# Patient Record
Sex: Male | Born: 1970 | Race: White | Hispanic: No | Marital: Single
Health system: Southern US, Community
[De-identification: ages and names within clinical notes are randomized; demographics above are authoritative.]

---

## 2005-12-25 ENCOUNTER — Ambulatory Visit: Payer: Self-pay | Admitting: Family Medicine

## 2006-02-11 ENCOUNTER — Ambulatory Visit: Payer: Self-pay | Admitting: Unknown Physician Specialty

## 2006-03-04 ENCOUNTER — Inpatient Hospital Stay: Payer: Self-pay | Admitting: Surgery

## 2006-03-04 ENCOUNTER — Other Ambulatory Visit: Payer: Self-pay

## 2006-03-09 ENCOUNTER — Inpatient Hospital Stay: Payer: Self-pay | Admitting: Surgery

## 2006-09-03 ENCOUNTER — Emergency Department: Payer: Self-pay | Admitting: Unknown Physician Specialty

## 2006-09-21 ENCOUNTER — Emergency Department: Payer: Self-pay | Admitting: General Practice

## 2007-03-11 ENCOUNTER — Other Ambulatory Visit: Payer: Self-pay

## 2007-03-11 ENCOUNTER — Emergency Department: Payer: Self-pay | Admitting: Emergency Medicine

## 2007-05-19 ENCOUNTER — Other Ambulatory Visit: Payer: Self-pay

## 2007-05-19 ENCOUNTER — Emergency Department: Payer: Self-pay | Admitting: Emergency Medicine

## 2007-06-08 ENCOUNTER — Inpatient Hospital Stay: Payer: Self-pay | Admitting: Internal Medicine

## 2007-07-14 ENCOUNTER — Emergency Department: Payer: Self-pay | Admitting: Emergency Medicine

## 2007-08-09 ENCOUNTER — Emergency Department: Payer: Self-pay | Admitting: Internal Medicine

## 2007-08-12 ENCOUNTER — Observation Stay: Payer: Self-pay | Admitting: Internal Medicine

## 2007-08-14 ENCOUNTER — Other Ambulatory Visit: Payer: Self-pay

## 2007-08-14 ENCOUNTER — Emergency Department: Payer: Self-pay | Admitting: Emergency Medicine

## 2007-08-24 ENCOUNTER — Inpatient Hospital Stay: Payer: Self-pay | Admitting: Internal Medicine

## 2008-01-13 ENCOUNTER — Emergency Department: Payer: Self-pay | Admitting: Emergency Medicine

## 2008-02-17 ENCOUNTER — Emergency Department: Payer: Self-pay | Admitting: Internal Medicine

## 2008-02-21 ENCOUNTER — Inpatient Hospital Stay: Payer: Self-pay | Admitting: Internal Medicine

## 2008-03-11 ENCOUNTER — Other Ambulatory Visit: Payer: Self-pay

## 2008-03-11 ENCOUNTER — Emergency Department: Payer: Self-pay | Admitting: Emergency Medicine

## 2008-03-12 ENCOUNTER — Observation Stay: Payer: Self-pay | Admitting: Internal Medicine

## 2008-04-15 ENCOUNTER — Other Ambulatory Visit: Payer: Self-pay

## 2008-04-15 ENCOUNTER — Emergency Department: Payer: Self-pay | Admitting: Internal Medicine

## 2008-05-06 ENCOUNTER — Emergency Department: Payer: Self-pay | Admitting: Emergency Medicine

## 2008-05-30 ENCOUNTER — Emergency Department: Payer: Self-pay | Admitting: Emergency Medicine

## 2008-06-05 ENCOUNTER — Emergency Department: Payer: Self-pay | Admitting: Emergency Medicine

## 2008-08-18 ENCOUNTER — Emergency Department: Payer: Self-pay | Admitting: Emergency Medicine

## 2008-11-17 ENCOUNTER — Emergency Department: Payer: Self-pay | Admitting: Emergency Medicine

## 2008-12-01 ENCOUNTER — Emergency Department: Payer: Self-pay | Admitting: Emergency Medicine

## 2008-12-08 ENCOUNTER — Emergency Department: Payer: Self-pay | Admitting: Emergency Medicine

## 2009-01-07 ENCOUNTER — Inpatient Hospital Stay: Payer: Self-pay | Admitting: Internal Medicine

## 2009-01-14 ENCOUNTER — Observation Stay: Payer: Self-pay | Admitting: Internal Medicine

## 2009-01-18 ENCOUNTER — Emergency Department: Payer: Self-pay | Admitting: Emergency Medicine

## 2009-02-24 ENCOUNTER — Emergency Department: Payer: Self-pay | Admitting: Emergency Medicine

## 2009-03-05 ENCOUNTER — Emergency Department: Payer: Self-pay | Admitting: Unknown Physician Specialty

## 2009-03-12 ENCOUNTER — Inpatient Hospital Stay: Payer: Self-pay | Admitting: Internal Medicine

## 2009-03-15 ENCOUNTER — Emergency Department: Payer: Self-pay | Admitting: Emergency Medicine

## 2009-03-30 ENCOUNTER — Ambulatory Visit: Payer: Self-pay | Admitting: Internal Medicine

## 2009-04-22 ENCOUNTER — Emergency Department: Payer: Self-pay | Admitting: Internal Medicine

## 2009-06-25 ENCOUNTER — Emergency Department: Payer: Self-pay | Admitting: Emergency Medicine

## 2009-07-13 ENCOUNTER — Emergency Department: Payer: Self-pay | Admitting: Emergency Medicine

## 2009-08-15 ENCOUNTER — Inpatient Hospital Stay: Payer: Self-pay | Admitting: Internal Medicine

## 2009-10-04 ENCOUNTER — Emergency Department: Payer: Self-pay | Admitting: Emergency Medicine

## 2009-10-27 ENCOUNTER — Emergency Department: Payer: Self-pay | Admitting: Emergency Medicine

## 2009-12-06 ENCOUNTER — Emergency Department: Payer: Self-pay | Admitting: Emergency Medicine

## 2010-01-19 ENCOUNTER — Emergency Department: Payer: Self-pay | Admitting: Emergency Medicine

## 2010-04-26 ENCOUNTER — Inpatient Hospital Stay: Payer: Self-pay | Admitting: Internal Medicine

## 2010-05-28 ENCOUNTER — Emergency Department: Payer: Self-pay | Admitting: Emergency Medicine

## 2012-11-25 ENCOUNTER — Emergency Department: Payer: Self-pay | Admitting: Emergency Medicine

## 2012-11-26 ENCOUNTER — Emergency Department: Payer: Self-pay | Admitting: Emergency Medicine

## 2012-11-26 LAB — URINALYSIS, COMPLETE
Bilirubin,UR: NEGATIVE
Blood: NEGATIVE
Ketone: NEGATIVE
Leukocyte Esterase: NEGATIVE
Nitrite: NEGATIVE
Specific Gravity: 1.011 (ref 1.003–1.030)
Squamous Epithelial: NONE SEEN

## 2012-11-26 LAB — CBC WITH DIFFERENTIAL/PLATELET
Basophil #: 0 10*3/uL (ref 0.0–0.1)
HCT: 31.1 % — ABNORMAL LOW (ref 40.0–52.0)
Lymphocyte #: 1.2 10*3/uL (ref 1.0–3.6)
Lymphocyte %: 26.7 %
MCH: 27.1 pg (ref 26.0–34.0)
MCV: 85 fL (ref 80–100)
Monocyte #: 0.3 x10 3/mm (ref 0.2–1.0)
Monocyte %: 7.7 %
Neutrophil #: 2.8 10*3/uL (ref 1.4–6.5)
Neutrophil %: 63.5 %
RBC: 3.68 10*6/uL — ABNORMAL LOW (ref 4.40–5.90)
RDW: 18.7 % — ABNORMAL HIGH (ref 11.5–14.5)
WBC: 4.5 10*3/uL (ref 3.8–10.6)

## 2012-11-26 LAB — BASIC METABOLIC PANEL
Calcium, Total: 8.2 mg/dL — ABNORMAL LOW (ref 8.5–10.1)
Chloride: 110 mmol/L — ABNORMAL HIGH (ref 98–107)
Co2: 21 mmol/L (ref 21–32)
Creatinine: 1.43 mg/dL — ABNORMAL HIGH (ref 0.60–1.30)
EGFR (Non-African Amer.): >60
Glucose: 382 mg/dL — ABNORMAL HIGH (ref 65–99)
Potassium: 3.6 mmol/L (ref 3.5–5.1)

## 2012-11-26 LAB — COMPREHENSIVE METABOLIC PANEL
Albumin: 3.2 g/dL — ABNORMAL LOW (ref 3.4–5.0)
Alkaline Phosphatase: 206 U/L — ABNORMAL HIGH (ref 50–136)
BUN: 22 mg/dL — ABNORMAL HIGH (ref 7–18)
Calcium, Total: 7.9 mg/dL — ABNORMAL LOW (ref 8.5–10.1)
Chloride: 102 mmol/L (ref 98–107)
Co2: 22 mmol/L (ref 21–32)
Creatinine: 1.63 mg/dL — ABNORMAL HIGH (ref 0.60–1.30)
EGFR (African American): >60
EGFR (Non-African Amer.): 52 — ABNORMAL LOW
Glucose: 723 mg/dL (ref 65–99)
Osmolality: 303 (ref 275–301)
SGPT (ALT): 145 U/L — ABNORMAL HIGH (ref 12–78)
Sodium: 132 mmol/L — ABNORMAL LOW (ref 136–145)

## 2012-11-26 LAB — LIPASE, BLOOD: Lipase: 21 U/L — ABNORMAL LOW (ref 73–393)

## 2012-12-01 ENCOUNTER — Observation Stay: Payer: Self-pay | Admitting: Internal Medicine

## 2012-12-01 LAB — COMPREHENSIVE METABOLIC PANEL
Alkaline Phosphatase: 177 U/L — ABNORMAL HIGH (ref 50–136)
Anion Gap: 7 (ref 7–16)
Co2: 26 mmol/L (ref 21–32)
EGFR (Non-African Amer.): 48 — ABNORMAL LOW
Osmolality: 294 (ref 275–301)
Potassium: 3.9 mmol/L (ref 3.5–5.1)
SGPT (ALT): 81 U/L — ABNORMAL HIGH (ref 12–78)
Sodium: 128 mmol/L — ABNORMAL LOW (ref 136–145)
Total Protein: 7.2 g/dL (ref 6.4–8.2)

## 2012-12-01 LAB — TROPONIN I
Troponin-I: <0.02 ng/mL
Troponin-I: <0.02 ng/mL

## 2012-12-01 LAB — CBC
HCT: 33.4 % — ABNORMAL LOW (ref 40.0–52.0)
RBC: 3.98 10*6/uL — ABNORMAL LOW (ref 4.40–5.90)

## 2012-12-01 LAB — LIPID PANEL
Cholesterol: 161 mg/dL (ref 0–200)
HDL Cholesterol: 65 mg/dL — ABNORMAL HIGH (ref 40–60)
Ldl Cholesterol, Calc: 34 mg/dL (ref 0–100)
Triglycerides: 308 mg/dL — ABNORMAL HIGH (ref 0–200)
VLDL Cholesterol, Calc: 62 mg/dL — ABNORMAL HIGH (ref 5–40)

## 2012-12-01 LAB — TSH: Thyroid Stimulating Horm: 2.48 u[IU]/mL

## 2012-12-01 LAB — PHOSPHORUS: Phosphorus: 3.5 mg/dL (ref 2.5–4.9)

## 2012-12-01 LAB — MAGNESIUM: Magnesium: 2 mg/dL

## 2012-12-02 LAB — CBC WITH DIFFERENTIAL/PLATELET
Basophil #: 0.1 10*3/uL (ref 0.0–0.1)
Eosinophil #: 0.2 10*3/uL (ref 0.0–0.7)
Eosinophil %: 2.7 %
HCT: 28.9 % — ABNORMAL LOW (ref 40.0–52.0)
HGB: 9.6 g/dL — ABNORMAL LOW (ref 13.0–18.0)
Lymphocyte #: 3 10*3/uL (ref 1.0–3.6)
Lymphocyte %: 49.9 %
MCH: 27.3 pg (ref 26.0–34.0)
MCHC: 33 g/dL (ref 32.0–36.0)
MCV: 83 fL (ref 80–100)
Monocyte %: 7.1 %
Neutrophil %: 38.9 %
RBC: 3.49 10*6/uL — ABNORMAL LOW (ref 4.40–5.90)
RDW: 18.2 % — ABNORMAL HIGH (ref 11.5–14.5)
WBC: 6 10*3/uL (ref 3.8–10.6)

## 2012-12-02 LAB — COMPREHENSIVE METABOLIC PANEL
Anion Gap: 5 — ABNORMAL LOW (ref 7–16)
Bilirubin,Total: 0.1 mg/dL — ABNORMAL LOW (ref 0.2–1.0)
Creatinine: 1.4 mg/dL — ABNORMAL HIGH (ref 0.60–1.30)
EGFR (African American): >60
EGFR (Non-African Amer.): >60
Glucose: 216 mg/dL — ABNORMAL HIGH (ref 65–99)
SGPT (ALT): 65 U/L (ref 12–78)
Sodium: 140 mmol/L (ref 136–145)
Total Protein: 6.4 g/dL (ref 6.4–8.2)

## 2012-12-02 LAB — TROPONIN I: Troponin-I: <0.02 ng/mL

## 2012-12-12 ENCOUNTER — Inpatient Hospital Stay: Payer: Self-pay | Admitting: Internal Medicine

## 2012-12-12 LAB — CBC
HCT: 35.4 % — ABNORMAL LOW (ref 40.0–52.0)
HGB: 11.5 g/dL — ABNORMAL LOW (ref 13.0–18.0)
MCV: 84 fL (ref 80–100)
Platelet: 272 10*3/uL (ref 150–440)
RBC: 4.23 10*6/uL — ABNORMAL LOW (ref 4.40–5.90)

## 2012-12-12 LAB — COMPREHENSIVE METABOLIC PANEL
Alkaline Phosphatase: 151 U/L — ABNORMAL HIGH (ref 50–136)
Anion Gap: 11 (ref 7–16)
Chloride: 96 mmol/L — ABNORMAL LOW (ref 98–107)
Co2: 22 mmol/L (ref 21–32)
Creatinine: 1.63 mg/dL — ABNORMAL HIGH (ref 0.60–1.30)
EGFR (African American): >60
EGFR (Non-African Amer.): 52 — ABNORMAL LOW
Glucose: 652 mg/dL (ref 65–99)

## 2012-12-13 LAB — BASIC METABOLIC PANEL
Anion Gap: 8 (ref 7–16)
BUN: 13 mg/dL (ref 7–18)
Calcium, Total: 8.1 mg/dL — ABNORMAL LOW (ref 8.5–10.1)
Co2: 24 mmol/L (ref 21–32)
Creatinine: 1.56 mg/dL — ABNORMAL HIGH (ref 0.60–1.30)
EGFR (Non-African Amer.): 55 — ABNORMAL LOW
Potassium: 3 mmol/L — ABNORMAL LOW (ref 3.5–5.1)
Sodium: 140 mmol/L (ref 136–145)

## 2012-12-13 LAB — CBC WITH DIFFERENTIAL/PLATELET
Eosinophil #: 0.1 10*3/uL (ref 0.0–0.7)
Eosinophil %: 2.1 %
HCT: 32.6 % — ABNORMAL LOW (ref 40.0–52.0)
Lymphocyte #: 3.2 10*3/uL (ref 1.0–3.6)
Lymphocyte %: 43.8 %
MCV: 83 fL (ref 80–100)
Monocyte #: 0.5 x10 3/mm (ref 0.2–1.0)
Neutrophil #: 3.3 10*3/uL (ref 1.4–6.5)
Neutrophil %: 46.4 %
Platelet: 270 10*3/uL (ref 150–440)
RBC: 3.94 10*6/uL — ABNORMAL LOW (ref 4.40–5.90)
WBC: 7.2 10*3/uL (ref 3.8–10.6)

## 2012-12-13 LAB — URINALYSIS, COMPLETE
Bacteria: NONE SEEN
Bilirubin,UR: NEGATIVE
Blood: NEGATIVE
Nitrite: NEGATIVE
Ph: 6 (ref 4.5–8.0)
Protein: NEGATIVE
RBC,UR: <1 /HPF (ref 0–5)
Specific Gravity: 1.009 (ref 1.003–1.030)
Squamous Epithelial: <1
WBC UR: <1 /HPF (ref 0–5)

## 2012-12-13 LAB — MAGNESIUM: Magnesium: 1.7 mg/dL — ABNORMAL LOW

## 2012-12-14 LAB — BASIC METABOLIC PANEL
Anion Gap: 6 — ABNORMAL LOW (ref 7–16)
Calcium, Total: 8.3 mg/dL — ABNORMAL LOW (ref 8.5–10.1)
Chloride: 107 mmol/L (ref 98–107)
Co2: 25 mmol/L (ref 21–32)
Creatinine: 1.22 mg/dL (ref 0.60–1.30)
EGFR (Non-African Amer.): >60
Glucose: 267 mg/dL — ABNORMAL HIGH (ref 65–99)
Osmolality: 284 (ref 275–301)

## 2012-12-14 LAB — CBC WITH DIFFERENTIAL/PLATELET
Basophil #: 0.1 10*3/uL (ref 0.0–0.1)
Basophil %: 1 %
Eosinophil #: 0.2 10*3/uL (ref 0.0–0.7)
Eosinophil %: 2.7 %
HCT: 31.4 % — ABNORMAL LOW (ref 40.0–52.0)
Lymphocyte #: 2.1 10*3/uL (ref 1.0–3.6)
Lymphocyte %: 35.3 %
MCH: 27.7 pg (ref 26.0–34.0)
MCHC: 33.6 g/dL (ref 32.0–36.0)
Neutrophil #: 3.2 10*3/uL (ref 1.4–6.5)
Neutrophil %: 54.4 %
RDW: 18 % — ABNORMAL HIGH (ref 11.5–14.5)

## 2012-12-14 LAB — MAGNESIUM: Magnesium: 1.4 mg/dL — ABNORMAL LOW

## 2012-12-16 ENCOUNTER — Emergency Department: Payer: Self-pay | Admitting: Emergency Medicine

## 2012-12-16 LAB — CBC WITH DIFFERENTIAL/PLATELET
Eosinophil #: 0.1 10*3/uL (ref 0.0–0.7)
HCT: 34.1 % — ABNORMAL LOW (ref 40.0–52.0)
Lymphocyte #: 1.9 10*3/uL (ref 1.0–3.6)
Lymphocyte %: 38.2 %
MCH: 27.1 pg (ref 26.0–34.0)
MCHC: 32.5 g/dL (ref 32.0–36.0)
MCV: 83 fL (ref 80–100)
RBC: 4.09 10*6/uL — ABNORMAL LOW (ref 4.40–5.90)

## 2012-12-16 LAB — COMPREHENSIVE METABOLIC PANEL
Albumin: 3.1 g/dL — ABNORMAL LOW (ref 3.4–5.0)
Calcium, Total: 8.3 mg/dL — ABNORMAL LOW (ref 8.5–10.1)
Chloride: 104 mmol/L (ref 98–107)
Co2: 19 mmol/L — ABNORMAL LOW (ref 21–32)
EGFR (Non-African Amer.): 54 — ABNORMAL LOW
Osmolality: 288 (ref 275–301)
Potassium: 4 mmol/L (ref 3.5–5.1)
SGOT(AST): 121 U/L — ABNORMAL HIGH (ref 15–37)
Total Protein: 6.8 g/dL (ref 6.4–8.2)

## 2012-12-16 LAB — LIPASE, BLOOD: Lipase: 22 U/L — ABNORMAL LOW (ref 73–393)

## 2012-12-21 ENCOUNTER — Inpatient Hospital Stay: Payer: Self-pay | Admitting: Internal Medicine

## 2012-12-21 LAB — CBC WITH DIFFERENTIAL/PLATELET
Eosinophil %: 1.1 %
HCT: 35.3 % — ABNORMAL LOW (ref 40.0–52.0)
HGB: 11.1 g/dL — ABNORMAL LOW (ref 13.0–18.0)
Lymphocyte #: 2.4 10*3/uL (ref 1.0–3.6)
Lymphocyte %: 33 %
MCH: 26.6 pg (ref 26.0–34.0)
Platelet: 253 10*3/uL (ref 150–440)
RBC: 4.18 10*6/uL — ABNORMAL LOW (ref 4.40–5.90)
RDW: 17.7 % — ABNORMAL HIGH (ref 11.5–14.5)
WBC: 7.3 10*3/uL (ref 3.8–10.6)

## 2012-12-21 LAB — COMPREHENSIVE METABOLIC PANEL
Calcium, Total: 8.8 mg/dL (ref 8.5–10.1)
Co2: 21 mmol/L (ref 21–32)
Creatinine: 2.43 mg/dL — ABNORMAL HIGH (ref 0.60–1.30)
EGFR (African American): 37 — ABNORMAL LOW
EGFR (Non-African Amer.): 32 — ABNORMAL LOW
Osmolality: 289 (ref 275–301)
SGOT(AST): 32 U/L (ref 15–37)
SGPT (ALT): 34 U/L (ref 12–78)
Sodium: 130 mmol/L — ABNORMAL LOW (ref 136–145)
Total Protein: 7.5 g/dL (ref 6.4–8.2)

## 2012-12-21 LAB — URINALYSIS, COMPLETE
Blood: NEGATIVE
Ketone: NEGATIVE
Leukocyte Esterase: NEGATIVE
Nitrite: NEGATIVE
Ph: 5 (ref 4.5–8.0)
Protein: NEGATIVE
RBC,UR: <1 /HPF (ref 0–5)
Specific Gravity: 1.01 (ref 1.003–1.030)
Squamous Epithelial: NONE SEEN
WBC UR: 3 /HPF (ref 0–5)

## 2012-12-22 LAB — CBC WITH DIFFERENTIAL/PLATELET
Basophil #: 0.1 10*3/uL (ref 0.0–0.1)
Basophil %: 1.9 %
Eosinophil %: 3.3 %
HCT: 29.1 % — ABNORMAL LOW (ref 40.0–52.0)
HGB: 9.7 g/dL — ABNORMAL LOW (ref 13.0–18.0)
Lymphocyte #: 2.7 10*3/uL (ref 1.0–3.6)
Lymphocyte %: 46.9 %
MCHC: 33.5 g/dL (ref 32.0–36.0)
MCV: 83 fL (ref 80–100)
Monocyte #: 0.5 x10 3/mm (ref 0.2–1.0)
Monocyte %: 7.9 %
Neutrophil #: 2.3 10*3/uL (ref 1.4–6.5)
Neutrophil %: 40 %
Platelet: 186 10*3/uL (ref 150–440)
RDW: 17.7 % — ABNORMAL HIGH (ref 11.5–14.5)
WBC: 5.7 10*3/uL (ref 3.8–10.6)

## 2012-12-22 LAB — BASIC METABOLIC PANEL
BUN: 20 mg/dL — ABNORMAL HIGH (ref 7–18)
Calcium, Total: 8.2 mg/dL — ABNORMAL LOW (ref 8.5–10.1)
Chloride: 105 mmol/L (ref 98–107)
EGFR (African American): >60
EGFR (Non-African Amer.): >60
Osmolality: 290 (ref 275–301)
Sodium: 137 mmol/L (ref 136–145)

## 2012-12-23 LAB — CBC WITH DIFFERENTIAL/PLATELET
Basophil #: 0.1 10*3/uL (ref 0.0–0.1)
Basophil %: 1.2 %
Eosinophil #: 0.2 10*3/uL (ref 0.0–0.7)
Eosinophil %: 5 %
HCT: 29.8 % — ABNORMAL LOW (ref 40.0–52.0)
HGB: 9.9 g/dL — ABNORMAL LOW (ref 13.0–18.0)
Lymphocyte #: 1.9 10*3/uL (ref 1.0–3.6)
Lymphocyte %: 43.7 %
MCH: 27.8 pg (ref 26.0–34.0)
MCHC: 33.4 g/dL (ref 32.0–36.0)
MCV: 83 fL (ref 80–100)
Monocyte #: 0.3 x10 3/mm (ref 0.2–1.0)
Monocyte %: 6.8 %
Neutrophil #: 1.9 10*3/uL (ref 1.4–6.5)
Neutrophil %: 43.3 %
Platelet: 192 10*3/uL (ref 150–440)
RBC: 3.57 10*6/uL — ABNORMAL LOW (ref 4.40–5.90)
RDW: 17.8 % — ABNORMAL HIGH (ref 11.5–14.5)
WBC: 4.4 10*3/uL (ref 3.8–10.6)

## 2012-12-23 LAB — BASIC METABOLIC PANEL
Anion Gap: 8 (ref 7–16)
BUN: 16 mg/dL (ref 7–18)
Calcium, Total: 8.1 mg/dL — ABNORMAL LOW (ref 8.5–10.1)
Chloride: 106 mmol/L (ref 98–107)
Co2: 26 mmol/L (ref 21–32)
Creatinine: 1.2 mg/dL (ref 0.60–1.30)
EGFR (African American): >60
EGFR (Non-African Amer.): >60
Glucose: 366 mg/dL — ABNORMAL HIGH (ref 65–99)
Osmolality: 295 (ref 275–301)
Potassium: 4.6 mmol/L (ref 3.5–5.1)
Sodium: 140 mmol/L (ref 136–145)

## 2012-12-24 LAB — CBC WITH DIFFERENTIAL/PLATELET
Basophil #: 0.1 10*3/uL (ref 0.0–0.1)
HCT: 32.3 % — ABNORMAL LOW (ref 40.0–52.0)
HGB: 10.7 g/dL — ABNORMAL LOW (ref 13.0–18.0)
MCHC: 33.2 g/dL (ref 32.0–36.0)
MCV: 83 fL (ref 80–100)
Monocyte #: 0.5 x10 3/mm (ref 0.2–1.0)
Neutrophil %: 44.8 %
Platelet: 232 10*3/uL (ref 150–440)
RBC: 3.91 10*6/uL — ABNORMAL LOW (ref 4.40–5.90)
RDW: 18 % — ABNORMAL HIGH (ref 11.5–14.5)
WBC: 6.5 10*3/uL (ref 3.8–10.6)

## 2012-12-24 LAB — BASIC METABOLIC PANEL
BUN: 17 mg/dL (ref 7–18)
Co2: 29 mmol/L (ref 21–32)
Creatinine: 1.34 mg/dL — ABNORMAL HIGH (ref 0.60–1.30)
EGFR (African American): >60
EGFR (Non-African Amer.): >60
Glucose: 107 mg/dL — ABNORMAL HIGH (ref 65–99)
Potassium: 3.9 mmol/L (ref 3.5–5.1)

## 2012-12-25 LAB — BASIC METABOLIC PANEL
Anion Gap: 7 (ref 7–16)
BUN: 14 mg/dL (ref 7–18)
Calcium, Total: 8.3 mg/dL — ABNORMAL LOW (ref 8.5–10.1)
Chloride: 108 mmol/L — ABNORMAL HIGH (ref 98–107)
Co2: 26 mmol/L (ref 21–32)
Creatinine: 1.66 mg/dL — ABNORMAL HIGH (ref 0.60–1.30)
EGFR (African American): 59 — ABNORMAL LOW
EGFR (Non-African Amer.): 51 — ABNORMAL LOW
Glucose: 172 mg/dL — ABNORMAL HIGH (ref 65–99)
Osmolality: 286 (ref 275–301)
Potassium: 4.1 mmol/L (ref 3.5–5.1)
Sodium: 141 mmol/L (ref 136–145)

## 2012-12-25 LAB — CBC WITH DIFFERENTIAL/PLATELET
Basophil #: 0.1 10*3/uL (ref 0.0–0.1)
Basophil %: 1.1 %
Eosinophil #: 0.2 10*3/uL (ref 0.0–0.7)
Eosinophil %: 3.4 %
HCT: 29.7 % — ABNORMAL LOW (ref 40.0–52.0)
HGB: 10 g/dL — ABNORMAL LOW (ref 13.0–18.0)
Lymphocyte #: 2.2 10*3/uL (ref 1.0–3.6)
Lymphocyte %: 42.1 %
MCH: 27.8 pg (ref 26.0–34.0)
MCHC: 33.6 g/dL (ref 32.0–36.0)
MCV: 83 fL (ref 80–100)
Monocyte #: 0.4 x10 3/mm (ref 0.2–1.0)
Monocyte %: 7.9 %
Neutrophil #: 2.3 10*3/uL (ref 1.4–6.5)
Neutrophil %: 45.5 %
Platelet: 200 10*3/uL (ref 150–440)
RBC: 3.58 10*6/uL — ABNORMAL LOW (ref 4.40–5.90)
RDW: 17.4 % — ABNORMAL HIGH (ref 11.5–14.5)
WBC: 5.1 10*3/uL (ref 3.8–10.6)

## 2012-12-28 ENCOUNTER — Emergency Department: Payer: Self-pay | Admitting: Psychiatry

## 2012-12-30 ENCOUNTER — Emergency Department: Payer: Self-pay | Admitting: Emergency Medicine

## 2012-12-30 LAB — COMPREHENSIVE METABOLIC PANEL
Albumin: 3.6 g/dL (ref 3.4–5.0)
Anion Gap: 7 (ref 7–16)
Bilirubin,Total: 0.2 mg/dL (ref 0.2–1.0)
Calcium, Total: 8.6 mg/dL (ref 8.5–10.1)
Chloride: 110 mmol/L — ABNORMAL HIGH (ref 98–107)
Co2: 22 mmol/L (ref 21–32)
Creatinine: 1.83 mg/dL — ABNORMAL HIGH (ref 0.60–1.30)
EGFR (African American): 52 — ABNORMAL LOW
Glucose: 132 mg/dL — ABNORMAL HIGH (ref 65–99)
SGOT(AST): 36 U/L (ref 15–37)
SGPT (ALT): 42 U/L (ref 12–78)
Sodium: 139 mmol/L (ref 136–145)
Total Protein: 7 g/dL (ref 6.4–8.2)

## 2012-12-30 LAB — CBC
HCT: 31.2 % — ABNORMAL LOW (ref 40.0–52.0)
HGB: 10.2 g/dL — ABNORMAL LOW (ref 13.0–18.0)
MCH: 27.5 pg (ref 26.0–34.0)
MCHC: 32.7 g/dL (ref 32.0–36.0)
MCV: 84 fL (ref 80–100)
Platelet: 210 10*3/uL (ref 150–440)
RBC: 3.71 10*6/uL — ABNORMAL LOW (ref 4.40–5.90)

## 2013-01-03 ENCOUNTER — Emergency Department: Payer: Self-pay | Admitting: Emergency Medicine

## 2013-01-03 LAB — URINALYSIS, COMPLETE
Bacteria: NONE SEEN
Bilirubin,UR: NEGATIVE
Blood: NEGATIVE
Glucose,UR: NEGATIVE mg/dL
Ketone: NEGATIVE
Leukocyte Esterase: NEGATIVE
Nitrite: NEGATIVE
Ph: 5
Protein: NEGATIVE
RBC,UR: <1 /HPF
Specific Gravity: 1.01
Squamous Epithelial: NONE SEEN
WBC UR: NONE SEEN /HPF

## 2013-01-08 ENCOUNTER — Emergency Department: Payer: Self-pay | Admitting: Emergency Medicine

## 2013-01-08 LAB — COMPREHENSIVE METABOLIC PANEL
Albumin: 3.4 g/dL (ref 3.4–5.0)
Alkaline Phosphatase: 120 U/L (ref 50–136)
Anion Gap: 10 (ref 7–16)
BUN: 26 mg/dL — ABNORMAL HIGH (ref 7–18)
Bilirubin,Total: 0.3 mg/dL (ref 0.2–1.0)
Calcium, Total: 8.8 mg/dL (ref 8.5–10.1)
Chloride: 101 mmol/L (ref 98–107)
EGFR (African American): 48 — ABNORMAL LOW
Glucose: 616 mg/dL (ref 65–99)
Potassium: 3.5 mmol/L (ref 3.5–5.1)
SGPT (ALT): 33 U/L (ref 12–78)
Sodium: 130 mmol/L — ABNORMAL LOW (ref 136–145)
Total Protein: 6.8 g/dL (ref 6.4–8.2)

## 2013-01-08 LAB — CBC
HGB: 11.5 g/dL — ABNORMAL LOW (ref 13.0–18.0)
MCHC: 32.1 g/dL (ref 32.0–36.0)
MCV: 85 fL (ref 80–100)
Platelet: 240 10*3/uL (ref 150–440)
RBC: 4.2 10*6/uL — ABNORMAL LOW (ref 4.40–5.90)
RDW: 16.9 % — ABNORMAL HIGH (ref 11.5–14.5)
WBC: 12.3 10*3/uL — ABNORMAL HIGH (ref 3.8–10.6)

## 2013-01-08 LAB — URINALYSIS, COMPLETE
Bacteria: NONE SEEN
Bilirubin,UR: NEGATIVE
Glucose,UR: >=500 mg/dL (ref 0–75)
Nitrite: NEGATIVE
Ph: 5 (ref 4.5–8.0)
WBC UR: NONE SEEN /HPF (ref 0–5)

## 2013-01-08 LAB — LIPASE, BLOOD: Lipase: 18 U/L — ABNORMAL LOW (ref 73–393)

## 2013-01-12 ENCOUNTER — Emergency Department: Payer: Self-pay | Admitting: Emergency Medicine

## 2013-01-12 LAB — COMPREHENSIVE METABOLIC PANEL
Calcium, Total: 9 mg/dL (ref 8.5–10.1)
Chloride: 103 mmol/L (ref 98–107)
Co2: 18 mmol/L — ABNORMAL LOW (ref 21–32)
Osmolality: 285 (ref 275–301)
Total Protein: 7.2 g/dL (ref 6.4–8.2)

## 2013-01-12 LAB — CBC
HGB: 12 g/dL — ABNORMAL LOW (ref 13.0–18.0)
MCH: 27.6 pg (ref 26.0–34.0)
MCV: 83 fL (ref 80–100)
Platelet: 298 10*3/uL (ref 150–440)
RBC: 4.34 10*6/uL — ABNORMAL LOW (ref 4.40–5.90)

## 2013-01-12 LAB — URINALYSIS, COMPLETE
Bacteria: NONE SEEN
Glucose,UR: >=500 mg/dL (ref 0–75)
Leukocyte Esterase: NEGATIVE
Nitrite: NEGATIVE
Ph: 5 (ref 4.5–8.0)
Protein: NEGATIVE
RBC,UR: NONE SEEN /HPF (ref 0–5)
Specific Gravity: 1.009 (ref 1.003–1.030)

## 2013-01-12 LAB — LIPASE, BLOOD: Lipase: 21 U/L — ABNORMAL LOW (ref 73–393)

## 2013-01-14 ENCOUNTER — Inpatient Hospital Stay: Payer: Self-pay | Admitting: Internal Medicine

## 2013-01-14 LAB — COMPREHENSIVE METABOLIC PANEL
Albumin: 3 g/dL — ABNORMAL LOW (ref 3.4–5.0)
Alkaline Phosphatase: 127 U/L (ref 50–136)
Anion Gap: 9 (ref 7–16)
Calcium, Total: 8.2 mg/dL — ABNORMAL LOW (ref 8.5–10.1)
EGFR (African American): 50 — ABNORMAL LOW
SGPT (ALT): 36 U/L (ref 12–78)
Sodium: 130 mmol/L — ABNORMAL LOW (ref 136–145)
Total Protein: 6.7 g/dL (ref 6.4–8.2)

## 2013-01-14 LAB — URINALYSIS, COMPLETE
Bacteria: NONE SEEN
Bilirubin,UR: NEGATIVE
Blood: NEGATIVE
Glucose,UR: >=500 mg/dL (ref 0–75)
Leukocyte Esterase: NEGATIVE
Nitrite: NEGATIVE
Squamous Epithelial: <1
WBC UR: NONE SEEN /HPF (ref 0–5)

## 2013-01-14 LAB — LIPASE, BLOOD: Lipase: 28 U/L — ABNORMAL LOW (ref 73–393)

## 2013-01-14 LAB — CBC
HCT: 32.1 % — ABNORMAL LOW (ref 40.0–52.0)
HGB: 10.4 g/dL — ABNORMAL LOW (ref 13.0–18.0)
MCHC: 32.4 g/dL (ref 32.0–36.0)
RBC: 3.82 10*6/uL — ABNORMAL LOW (ref 4.40–5.90)
RDW: 16.3 % — ABNORMAL HIGH (ref 11.5–14.5)

## 2013-01-15 LAB — BASIC METABOLIC PANEL
BUN: 14 mg/dL (ref 7–18)
Chloride: 110 mmol/L — ABNORMAL HIGH (ref 98–107)
Co2: 25 mmol/L (ref 21–32)
EGFR (African American): >60
EGFR (Non-African Amer.): >60
Osmolality: 281 (ref 275–301)
Potassium: 3.6 mmol/L (ref 3.5–5.1)

## 2013-01-16 LAB — BASIC METABOLIC PANEL
Anion Gap: 6 — ABNORMAL LOW (ref 7–16)
BUN: 10 mg/dL (ref 7–18)
Chloride: 109 mmol/L — ABNORMAL HIGH (ref 98–107)
Co2: 26 mmol/L (ref 21–32)
EGFR (African American): >60
EGFR (Non-African Amer.): >60
Osmolality: 286 (ref 275–301)
Potassium: 4.2 mmol/L (ref 3.5–5.1)
Sodium: 141 mmol/L (ref 136–145)

## 2013-01-17 LAB — BASIC METABOLIC PANEL
Anion Gap: 4 — ABNORMAL LOW (ref 7–16)
BUN: 14 mg/dL (ref 7–18)
Calcium, Total: 8.2 mg/dL — ABNORMAL LOW (ref 8.5–10.1)
Chloride: 112 mmol/L — ABNORMAL HIGH (ref 98–107)
Co2: 28 mmol/L (ref 21–32)
Creatinine: 1.3 mg/dL (ref 0.60–1.30)
EGFR (African American): >60
EGFR (Non-African Amer.): >60
Potassium: 4 mmol/L (ref 3.5–5.1)

## 2013-01-17 LAB — PLATELET COUNT: Platelet: 260 10*3/uL (ref 150–440)

## 2013-01-18 LAB — BASIC METABOLIC PANEL
Anion Gap: 8 (ref 7–16)
BUN: 18 mg/dL (ref 7–18)
Calcium, Total: 8.6 mg/dL (ref 8.5–10.1)
Chloride: 105 mmol/L (ref 98–107)
Co2: 27 mmol/L (ref 21–32)
Creatinine: 1.54 mg/dL — ABNORMAL HIGH (ref 0.60–1.30)
EGFR (African American): >60
EGFR (Non-African Amer.): 56 — ABNORMAL LOW
Glucose: 420 mg/dL — ABNORMAL HIGH (ref 65–99)
Osmolality: 299 (ref 275–301)
Potassium: 4.3 mmol/L (ref 3.5–5.1)
Sodium: 140 mmol/L (ref 136–145)

## 2013-01-18 LAB — CBC WITH DIFFERENTIAL/PLATELET
Basophil #: 0.1 10*3/uL (ref 0.0–0.1)
Basophil %: 1.3 %
Eosinophil #: 0.1 10*3/uL (ref 0.0–0.7)
Eosinophil %: 1.9 %
HCT: 29.4 % — ABNORMAL LOW (ref 40.0–52.0)
HGB: 9.9 g/dL — ABNORMAL LOW (ref 13.0–18.0)
Lymphocyte #: 2 10*3/uL (ref 1.0–3.6)
Lymphocyte %: 30 %
MCH: 28.4 pg (ref 26.0–34.0)
MCHC: 33.8 g/dL (ref 32.0–36.0)
MCV: 84 fL (ref 80–100)
Monocyte #: 0.4 x10 3/mm (ref 0.2–1.0)
Monocyte %: 5.3 %
Neutrophil #: 4.1 10*3/uL (ref 1.4–6.5)
Neutrophil %: 61.5 %
Platelet: 249 10*3/uL (ref 150–440)
RBC: 3.49 10*6/uL — ABNORMAL LOW (ref 4.40–5.90)
RDW: 16 % — ABNORMAL HIGH (ref 11.5–14.5)
WBC: 6.7 10*3/uL (ref 3.8–10.6)

## 2013-01-18 LAB — IRON AND TIBC
Iron Bind.Cap.(Total): 208 ug/dL — ABNORMAL LOW (ref 250–450)
Iron Saturation: 35 %
Iron: 72 ug/dL (ref 65–175)
Unbound Iron-Bind.Cap.: 136 ug/dL

## 2013-01-20 LAB — BASIC METABOLIC PANEL
Anion Gap: 8 (ref 7–16)
BUN: 19 mg/dL — ABNORMAL HIGH (ref 7–18)
Calcium, Total: 8.9 mg/dL (ref 8.5–10.1)
Chloride: 108 mmol/L — ABNORMAL HIGH (ref 98–107)
Co2: 27 mmol/L (ref 21–32)
Creatinine: 1.64 mg/dL — ABNORMAL HIGH (ref 0.60–1.30)
EGFR (African American): 60 — ABNORMAL LOW
EGFR (Non-African Amer.): 52 — ABNORMAL LOW
Glucose: 85 mg/dL (ref 65–99)
Osmolality: 286 (ref 275–301)
Potassium: 4.5 mmol/L (ref 3.5–5.1)
Sodium: 143 mmol/L (ref 136–145)

## 2013-01-21 LAB — BASIC METABOLIC PANEL
Chloride: 112 mmol/L — ABNORMAL HIGH (ref 98–107)
Creatinine: 1.95 mg/dL — ABNORMAL HIGH (ref 0.60–1.30)
EGFR (Non-African Amer.): 42 — ABNORMAL LOW
Glucose: 227 mg/dL — ABNORMAL HIGH (ref 65–99)
Osmolality: 294 (ref 275–301)
Sodium: 143 mmol/L (ref 136–145)

## 2013-01-22 LAB — BASIC METABOLIC PANEL
BUN: 19 mg/dL — ABNORMAL HIGH (ref 7–18)
Calcium, Total: 8.3 mg/dL — ABNORMAL LOW (ref 8.5–10.1)
Co2: 26 mmol/L (ref 21–32)
Creatinine: 1.6 mg/dL — ABNORMAL HIGH (ref 0.60–1.30)
EGFR (African American): >60
EGFR (Non-African Amer.): 53 — ABNORMAL LOW
Osmolality: 291 (ref 275–301)
Potassium: 3.8 mmol/L (ref 3.5–5.1)
Sodium: 142 mmol/L (ref 136–145)

## 2013-01-22 LAB — SODIUM, URINE, RANDOM: Sodium, Urine Random: 140 mmol/L (ref 20–110)

## 2013-01-22 LAB — CREATININE, URINE, RANDOM: Creatinine, Urine Random: 10 mg/dL — ABNORMAL LOW (ref 30.0–125.0)

## 2013-01-26 LAB — LIPASE, BLOOD: Lipase: 31 U/L — ABNORMAL LOW (ref 73–393)

## 2013-01-26 LAB — COMPREHENSIVE METABOLIC PANEL
Albumin: 3.8 g/dL (ref 3.4–5.0)
Alkaline Phosphatase: 149 U/L — ABNORMAL HIGH (ref 50–136)
Anion Gap: 13 (ref 7–16)
Calcium, Total: 9.3 mg/dL (ref 8.5–10.1)
Chloride: 95 mmol/L — ABNORMAL LOW (ref 98–107)
Co2: 20 mmol/L — ABNORMAL LOW (ref 21–32)
Creatinine: 2.33 mg/dL — ABNORMAL HIGH (ref 0.60–1.30)
EGFR (Non-African Amer.): 34 — ABNORMAL LOW
Potassium: 4.1 mmol/L (ref 3.5–5.1)
SGOT(AST): 62 U/L — ABNORMAL HIGH (ref 15–37)
Total Protein: 7.8 g/dL (ref 6.4–8.2)

## 2013-01-26 LAB — CBC
HCT: 37.3 % — ABNORMAL LOW (ref 40.0–52.0)
MCHC: 32.8 g/dL (ref 32.0–36.0)
Platelet: 256 10*3/uL (ref 150–440)
RBC: 4.35 10*6/uL — ABNORMAL LOW (ref 4.40–5.90)

## 2013-01-27 ENCOUNTER — Inpatient Hospital Stay: Payer: Self-pay | Admitting: Internal Medicine

## 2013-01-27 LAB — BASIC METABOLIC PANEL
Anion Gap: 14 (ref 7–16)
BUN: 32 mg/dL — ABNORMAL HIGH (ref 7–18)
Creatinine: 2.02 mg/dL — ABNORMAL HIGH (ref 0.60–1.30)
EGFR (Non-African Amer.): 40 — ABNORMAL LOW
Potassium: 3.8 mmol/L (ref 3.5–5.1)
Sodium: 134 mmol/L — ABNORMAL LOW (ref 136–145)

## 2013-01-27 LAB — URINALYSIS, COMPLETE
Bilirubin,UR: NEGATIVE
Blood: NEGATIVE
Glucose,UR: >=500 mg/dL (ref 0–75)
Nitrite: NEGATIVE
Ph: 6 (ref 4.5–8.0)
Protein: NEGATIVE
RBC,UR: <1 /HPF (ref 0–5)
Specific Gravity: 1.014 (ref 1.003–1.030)
Squamous Epithelial: NONE SEEN

## 2013-01-28 LAB — BASIC METABOLIC PANEL
Anion Gap: 8 (ref 7–16)
BUN: 31 mg/dL — ABNORMAL HIGH (ref 7–18)
Creatinine: 2.54 mg/dL — ABNORMAL HIGH (ref 0.60–1.30)
EGFR (African American): 35 — ABNORMAL LOW
Glucose: 171 mg/dL — ABNORMAL HIGH (ref 65–99)
Sodium: 142 mmol/L (ref 136–145)

## 2013-02-02 ENCOUNTER — Emergency Department: Payer: Self-pay | Admitting: Emergency Medicine

## 2013-02-02 LAB — URINALYSIS, COMPLETE
Bilirubin,UR: NEGATIVE
Glucose,UR: >=500 mg/dL (ref 0–75)
Nitrite: NEGATIVE
Protein: NEGATIVE
Specific Gravity: 1.011 (ref 1.003–1.030)
Squamous Epithelial: <1
WBC UR: 1 /HPF (ref 0–5)

## 2013-02-08 ENCOUNTER — Inpatient Hospital Stay: Payer: Self-pay | Admitting: Internal Medicine

## 2013-02-08 LAB — COMPREHENSIVE METABOLIC PANEL
Albumin: 3.3 g/dL — ABNORMAL LOW (ref 3.4–5.0)
Anion Gap: 29 — ABNORMAL HIGH (ref 7–16)
BUN: 40 mg/dL — ABNORMAL HIGH (ref 7–18)
Calcium, Total: 7.9 mg/dL — ABNORMAL LOW (ref 8.5–10.1)
Co2: 6 mmol/L — CL (ref 21–32)
Creatinine: 2.4 mg/dL — ABNORMAL HIGH (ref 0.60–1.30)
Potassium: 4.9 mmol/L (ref 3.5–5.1)
SGOT(AST): 1945 U/L — ABNORMAL HIGH (ref 15–37)
SGPT (ALT): 345 U/L — ABNORMAL HIGH (ref 12–78)
Total Protein: 7.2 g/dL (ref 6.4–8.2)

## 2013-02-08 LAB — BASIC METABOLIC PANEL
Anion Gap: 8 (ref 7–16)
Calcium, Total: 8.5 mg/dL (ref 8.5–10.1)
Chloride: 109 mmol/L — ABNORMAL HIGH (ref 98–107)
Co2: 22 mmol/L (ref 21–32)
EGFR (African American): 45 — ABNORMAL LOW
EGFR (Non-African Amer.): 39 — ABNORMAL LOW
Glucose: 61 mg/dL — ABNORMAL LOW (ref 65–99)
Osmolality: 283 (ref 275–301)

## 2013-02-08 LAB — URINALYSIS, COMPLETE
Bilirubin,UR: NEGATIVE
Nitrite: NEGATIVE
Protein: NEGATIVE
WBC UR: <1 /HPF (ref 0–5)

## 2013-02-08 LAB — CBC
MCV: 95 fL (ref 80–100)
Platelet: 390 10*3/uL (ref 150–440)
RDW: 14.9 % — ABNORMAL HIGH (ref 11.5–14.5)
WBC: 10.3 10*3/uL (ref 3.8–10.6)

## 2013-02-09 LAB — BASIC METABOLIC PANEL
Anion Gap: 5 — ABNORMAL LOW (ref 7–16)
Calcium, Total: 8.3 mg/dL — ABNORMAL LOW (ref 8.5–10.1)
Creatinine: 2.24 mg/dL — ABNORMAL HIGH (ref 0.60–1.30)
EGFR (Non-African Amer.): 35 — ABNORMAL LOW
Osmolality: 301 (ref 275–301)
Potassium: 5.1 mmol/L (ref 3.5–5.1)
Sodium: 142 mmol/L (ref 136–145)

## 2013-02-09 LAB — CBC WITH DIFFERENTIAL/PLATELET
Eosinophil %: 0.1 %
HCT: 27.4 % — ABNORMAL LOW (ref 40.0–52.0)
HGB: 9.3 g/dL — ABNORMAL LOW (ref 13.0–18.0)
Lymphocyte #: 1.8 10*3/uL (ref 1.0–3.6)
Lymphocyte %: 18.6 %
MCH: 28.3 pg (ref 26.0–34.0)
MCV: 83 fL (ref 80–100)
Monocyte %: 7.2 %
Neutrophil %: 73.4 %
Platelet: 324 10*3/uL (ref 150–440)
RBC: 3.29 10*6/uL — ABNORMAL LOW (ref 4.40–5.90)
WBC: 9.7 10*3/uL (ref 3.8–10.6)

## 2013-02-10 LAB — BASIC METABOLIC PANEL
Anion Gap: 10 (ref 7–16)
Calcium, Total: 8.9 mg/dL (ref 8.5–10.1)
Creatinine: 1.56 mg/dL — ABNORMAL HIGH (ref 0.60–1.30)
EGFR (African American): >60
Glucose: 338 mg/dL — ABNORMAL HIGH (ref 65–99)
Potassium: 4.3 mmol/L (ref 3.5–5.1)

## 2013-02-10 LAB — CBC WITH DIFFERENTIAL/PLATELET
Basophil #: 0.1 10*3/uL (ref 0.0–0.1)
Eosinophil #: 0.1 10*3/uL (ref 0.0–0.7)
HGB: 10.1 g/dL — ABNORMAL LOW (ref 13.0–18.0)
MCH: 28.1 pg (ref 26.0–34.0)
MCHC: 33.4 g/dL (ref 32.0–36.0)
Monocyte %: 6.8 %
Neutrophil #: 5.5 10*3/uL (ref 1.4–6.5)
Neutrophil %: 67.6 %
Platelet: 278 10*3/uL (ref 150–440)
WBC: 8.1 10*3/uL (ref 3.8–10.6)

## 2013-02-11 LAB — CBC WITH DIFFERENTIAL/PLATELET
Basophil %: 0.8 %
Eosinophil %: 3.5 %
HCT: 25.4 % — ABNORMAL LOW (ref 40.0–52.0)
Lymphocyte #: 1.8 10*3/uL (ref 1.0–3.6)
Lymphocyte %: 45 %
MCH: 28.2 pg (ref 26.0–34.0)
Monocyte %: 9.8 %
Neutrophil #: 1.6 10*3/uL (ref 1.4–6.5)
Neutrophil %: 40.9 %
RBC: 2.97 10*6/uL — ABNORMAL LOW (ref 4.40–5.90)
RDW: 14.3 % (ref 11.5–14.5)
WBC: 3.9 10*3/uL (ref 3.8–10.6)

## 2013-02-11 LAB — BASIC METABOLIC PANEL
Anion Gap: 4 — ABNORMAL LOW (ref 7–16)
BUN: 16 mg/dL (ref 7–18)
Calcium, Total: 7.6 mg/dL — ABNORMAL LOW (ref 8.5–10.1)
Chloride: 107 mmol/L (ref 98–107)
Creatinine: 1.64 mg/dL — ABNORMAL HIGH (ref 0.60–1.30)
EGFR (Non-African Amer.): 52 — ABNORMAL LOW
Sodium: 138 mmol/L (ref 136–145)

## 2013-02-12 LAB — CBC WITH DIFFERENTIAL/PLATELET
Basophil #: 0 10*3/uL (ref 0.0–0.1)
HCT: 26.5 % — ABNORMAL LOW (ref 40.0–52.0)
Lymphocyte #: 2.1 10*3/uL (ref 1.0–3.6)
Lymphocyte %: 42.4 %
MCH: 28.1 pg (ref 26.0–34.0)
MCHC: 33.2 g/dL (ref 32.0–36.0)
MCV: 85 fL (ref 80–100)
Monocyte #: 0.4 x10 3/mm (ref 0.2–1.0)
Monocyte %: 7.7 %
Neutrophil #: 2.2 10*3/uL (ref 1.4–6.5)
Platelet: 239 10*3/uL (ref 150–440)
RBC: 3.13 10*6/uL — ABNORMAL LOW (ref 4.40–5.90)
RDW: 13.8 % (ref 11.5–14.5)
WBC: 4.9 10*3/uL (ref 3.8–10.6)

## 2013-02-12 LAB — BASIC METABOLIC PANEL
BUN: 14 mg/dL (ref 7–18)
Calcium, Total: 8.2 mg/dL — ABNORMAL LOW (ref 8.5–10.1)
Chloride: 105 mmol/L (ref 98–107)
Co2: 28 mmol/L (ref 21–32)
EGFR (African American): >60
EGFR (Non-African Amer.): 52 — ABNORMAL LOW
Osmolality: 287 (ref 275–301)
Potassium: 4 mmol/L (ref 3.5–5.1)
Sodium: 139 mmol/L (ref 136–145)

## 2013-02-13 LAB — GLUCOSE, RANDOM: Glucose: 611 mg/dL (ref 65–99)

## 2013-02-14 LAB — BASIC METABOLIC PANEL
BUN: 22 mg/dL — ABNORMAL HIGH (ref 7–18)
Calcium, Total: 8.9 mg/dL (ref 8.5–10.1)
Chloride: 103 mmol/L (ref 98–107)
Co2: 32 mmol/L (ref 21–32)
Creatinine: 1.52 mg/dL — ABNORMAL HIGH (ref 0.60–1.30)
EGFR (Non-African Amer.): 56 — ABNORMAL LOW
Sodium: 140 mmol/L (ref 136–145)

## 2013-02-15 LAB — BASIC METABOLIC PANEL
Anion Gap: 6 — ABNORMAL LOW (ref 7–16)
BUN: 24 mg/dL — ABNORMAL HIGH (ref 7–18)
Chloride: 109 mmol/L — ABNORMAL HIGH (ref 98–107)
Co2: 26 mmol/L (ref 21–32)
Creatinine: 1.81 mg/dL — ABNORMAL HIGH (ref 0.60–1.30)
EGFR (African American): 53 — ABNORMAL LOW
EGFR (Non-African Amer.): 46 — ABNORMAL LOW
Glucose: 389 mg/dL — ABNORMAL HIGH (ref 65–99)
Potassium: 4.8 mmol/L (ref 3.5–5.1)
Sodium: 141 mmol/L (ref 136–145)

## 2013-02-15 LAB — PLATELET COUNT: Platelet: 268 10*3/uL (ref 150–440)

## 2013-02-16 LAB — BASIC METABOLIC PANEL WITH GFR
Anion Gap: 4 — ABNORMAL LOW
BUN: 27 mg/dL — ABNORMAL HIGH
Calcium, Total: 8.8 mg/dL
Chloride: 103 mmol/L
Co2: 31 mmol/L
Creatinine: 1.69 mg/dL — ABNORMAL HIGH
EGFR (African American): 58 — ABNORMAL LOW
EGFR (Non-African Amer.): 50 — ABNORMAL LOW
Glucose: 113 mg/dL — ABNORMAL HIGH
Osmolality: 282
Potassium: 3.8 mmol/L
Sodium: 138 mmol/L

## 2013-02-16 LAB — DRUG SCREEN, URINE
Amphetamines, Ur Screen: NEGATIVE (ref ?–1000)
Benzodiazepine, Ur Scrn: NEGATIVE (ref ?–200)
Cannabinoid 50 Ng, Ur ~~LOC~~: NEGATIVE (ref ?–50)
Cocaine Metabolite,Ur ~~LOC~~: NEGATIVE (ref ?–300)
Methadone, Ur Screen: NEGATIVE (ref ?–300)
Opiate, Ur Screen: POSITIVE (ref ?–300)
Tricyclic, Ur Screen: NEGATIVE (ref ?–1000)

## 2013-02-27 ENCOUNTER — Inpatient Hospital Stay: Payer: Self-pay | Admitting: Internal Medicine

## 2013-02-27 LAB — COMPREHENSIVE METABOLIC PANEL
Albumin: 3.5 g/dL (ref 3.4–5.0)
Alkaline Phosphatase: 160 U/L — ABNORMAL HIGH (ref 50–136)
Bilirubin,Total: 0.3 mg/dL (ref 0.2–1.0)
Calcium, Total: 8.5 mg/dL (ref 8.5–10.1)
Co2: 19 mmol/L — ABNORMAL LOW (ref 21–32)
Osmolality: 281 (ref 275–301)
Potassium: 4.5 mmol/L (ref 3.5–5.1)
SGPT (ALT): 39 U/L (ref 12–78)

## 2013-02-27 LAB — CBC
MCH: 28.8 pg (ref 26.0–34.0)
MCHC: 33.4 g/dL (ref 32.0–36.0)
MCV: 86 fL (ref 80–100)
Platelet: 301 10*3/uL (ref 150–440)
WBC: 8.6 10*3/uL (ref 3.8–10.6)

## 2013-02-27 LAB — URINALYSIS, COMPLETE
Bilirubin,UR: NEGATIVE
Glucose,UR: >=500 mg/dL (ref 0–75)
Leukocyte Esterase: NEGATIVE
Nitrite: NEGATIVE
Ph: 5 (ref 4.5–8.0)
RBC,UR: <1 /HPF (ref 0–5)
Specific Gravity: 1.007 (ref 1.003–1.030)
WBC UR: 1 /HPF (ref 0–5)

## 2013-02-27 LAB — LIPASE, BLOOD: Lipase: 38 U/L — ABNORMAL LOW (ref 73–393)

## 2013-02-28 LAB — URINALYSIS, COMPLETE
Blood: NEGATIVE
Glucose,UR: 150 mg/dL (ref 0–75)
Ketone: NEGATIVE
Leukocyte Esterase: NEGATIVE
Nitrite: NEGATIVE
Protein: NEGATIVE
Specific Gravity: 1.008 (ref 1.003–1.030)
Squamous Epithelial: NONE SEEN
WBC UR: <1 /HPF (ref 0–5)

## 2013-02-28 LAB — CBC WITH DIFFERENTIAL/PLATELET
Basophil #: 0.1 10*3/uL (ref 0.0–0.1)
Eosinophil #: 0 10*3/uL (ref 0.0–0.7)
Eosinophil %: 0.7 %
HGB: 8.1 g/dL — ABNORMAL LOW (ref 13.0–18.0)
Lymphocyte %: 21.7 %
MCH: 28.8 pg (ref 26.0–34.0)
MCV: 86 fL (ref 80–100)
Monocyte #: 0.6 x10 3/mm (ref 0.2–1.0)
Monocyte %: 10.7 %
Neutrophil #: 3.8 10*3/uL (ref 1.4–6.5)
Neutrophil %: 65.7 %
Platelet: 242 10*3/uL (ref 150–440)
RBC: 2.81 10*6/uL — ABNORMAL LOW (ref 4.40–5.90)
RDW: 14.4 % (ref 11.5–14.5)
WBC: 5.8 10*3/uL (ref 3.8–10.6)

## 2013-02-28 LAB — CREATININE, SERUM
EGFR (African American): >60
EGFR (Non-African Amer.): 54 — ABNORMAL LOW

## 2013-03-02 LAB — CBC WITH DIFFERENTIAL/PLATELET
Eosinophil #: 0.1 10*3/uL (ref 0.0–0.7)
HCT: 26.4 % — ABNORMAL LOW (ref 40.0–52.0)
HGB: 8.8 g/dL — ABNORMAL LOW (ref 13.0–18.0)
Lymphocyte #: 2.1 10*3/uL (ref 1.0–3.6)
Lymphocyte %: 30.4 %
MCH: 29 pg (ref 26.0–34.0)
MCHC: 33.4 g/dL (ref 32.0–36.0)
MCV: 87 fL (ref 80–100)
Monocyte #: 0.7 x10 3/mm (ref 0.2–1.0)
Neutrophil #: 4 10*3/uL (ref 1.4–6.5)
Neutrophil %: 57 %
Platelet: 235 10*3/uL (ref 150–440)
WBC: 7 10*3/uL (ref 3.8–10.6)

## 2013-03-02 LAB — COMPREHENSIVE METABOLIC PANEL
Albumin: 2.7 g/dL — ABNORMAL LOW (ref 3.4–5.0)
Anion Gap: 6 — ABNORMAL LOW (ref 7–16)
Bilirubin,Total: 0.3 mg/dL (ref 0.2–1.0)
Creatinine: 2 mg/dL — ABNORMAL HIGH (ref 0.60–1.30)
EGFR (Non-African Amer.): 41 — ABNORMAL LOW
Glucose: 364 mg/dL — ABNORMAL HIGH (ref 65–99)
Osmolality: 294 (ref 275–301)
Potassium: 4.4 mmol/L (ref 3.5–5.1)
SGPT (ALT): 27 U/L (ref 12–78)
Total Protein: 6.3 g/dL — ABNORMAL LOW (ref 6.4–8.2)

## 2013-03-03 LAB — COMPREHENSIVE METABOLIC PANEL
Albumin: 2.4 g/dL — ABNORMAL LOW (ref 3.4–5.0)
BUN: 17 mg/dL (ref 7–18)
Calcium, Total: 8 mg/dL — ABNORMAL LOW (ref 8.5–10.1)
Creatinine: 1.78 mg/dL — ABNORMAL HIGH (ref 0.60–1.30)
Osmolality: 291 (ref 275–301)
Potassium: 4.5 mmol/L (ref 3.5–5.1)
SGPT (ALT): 21 U/L (ref 12–78)

## 2013-03-04 LAB — BASIC METABOLIC PANEL
BUN: 14 mg/dL (ref 7–18)
Co2: 24 mmol/L (ref 21–32)
Creatinine: 1.68 mg/dL — ABNORMAL HIGH (ref 0.60–1.30)
EGFR (African American): 58 — ABNORMAL LOW
EGFR (Non-African Amer.): 50 — ABNORMAL LOW
Glucose: 245 mg/dL — ABNORMAL HIGH (ref 65–99)
Osmolality: 290 (ref 275–301)
Sodium: 141 mmol/L (ref 136–145)

## 2013-03-05 LAB — CULTURE, BLOOD (SINGLE)

## 2013-03-11 ENCOUNTER — Emergency Department: Payer: Self-pay | Admitting: Unknown Physician Specialty

## 2013-03-11 LAB — CBC
HCT: 34.2 % — ABNORMAL LOW (ref 40.0–52.0)
MCHC: 33.1 g/dL (ref 32.0–36.0)
MCV: 87 fL (ref 80–100)
RBC: 3.92 10*6/uL — ABNORMAL LOW (ref 4.40–5.90)

## 2013-03-11 LAB — COMPREHENSIVE METABOLIC PANEL
Alkaline Phosphatase: 157 U/L — ABNORMAL HIGH (ref 50–136)
Anion Gap: 11 (ref 7–16)
Calcium, Total: 9 mg/dL (ref 8.5–10.1)
Creatinine: 1.78 mg/dL — ABNORMAL HIGH (ref 0.60–1.30)
EGFR (Non-African Amer.): 47 — ABNORMAL LOW
Potassium: 3.7 mmol/L (ref 3.5–5.1)
SGOT(AST): 35 U/L (ref 15–37)
SGPT (ALT): 27 U/L (ref 12–78)
Total Protein: 8.1 g/dL (ref 6.4–8.2)

## 2013-03-12 ENCOUNTER — Emergency Department: Payer: Self-pay | Admitting: Emergency Medicine

## 2013-03-12 LAB — URINALYSIS, COMPLETE
Bacteria: NONE SEEN
Bilirubin,UR: NEGATIVE
Blood: NEGATIVE
Glucose,UR: 300 mg/dL (ref 0–75)
Leukocyte Esterase: NEGATIVE
Ph: 5 (ref 4.5–8.0)
Protein: NEGATIVE
Specific Gravity: 1.005 (ref 1.003–1.030)
Squamous Epithelial: NONE SEEN
WBC UR: <1 /HPF (ref 0–5)

## 2013-03-12 LAB — COMPREHENSIVE METABOLIC PANEL
Albumin: 2.7 g/dL — ABNORMAL LOW (ref 3.4–5.0)
Anion Gap: 8 (ref 7–16)
Bilirubin,Total: 0.2 mg/dL (ref 0.2–1.0)
Calcium, Total: 7.6 mg/dL — ABNORMAL LOW (ref 8.5–10.1)
Chloride: 100 mmol/L (ref 98–107)
Co2: 23 mmol/L (ref 21–32)
Creatinine: 1.6 mg/dL — ABNORMAL HIGH (ref 0.60–1.30)
Glucose: 592 mg/dL (ref 65–99)
Potassium: 2.9 mmol/L — ABNORMAL LOW (ref 3.5–5.1)
SGPT (ALT): 26 U/L (ref 12–78)
Sodium: 131 mmol/L — ABNORMAL LOW (ref 136–145)

## 2013-03-12 LAB — CBC
HCT: 29.2 % — ABNORMAL LOW (ref 40.0–52.0)
HGB: 9.4 g/dL — ABNORMAL LOW (ref 13.0–18.0)
MCH: 28.5 pg (ref 26.0–34.0)
MCV: 89 fL (ref 80–100)
RDW: 14 % (ref 11.5–14.5)
WBC: 8.3 10*3/uL (ref 3.8–10.6)

## 2013-03-12 LAB — LIPASE, BLOOD: Lipase: 25 U/L — ABNORMAL LOW (ref 73–393)

## 2013-03-19 ENCOUNTER — Ambulatory Visit: Payer: Self-pay | Admitting: Oncology

## 2013-03-22 ENCOUNTER — Emergency Department: Payer: Self-pay | Admitting: Emergency Medicine

## 2013-03-26 ENCOUNTER — Ambulatory Visit: Payer: Self-pay | Admitting: Oncology

## 2013-04-01 ENCOUNTER — Emergency Department: Payer: Self-pay | Admitting: Emergency Medicine

## 2013-04-03 ENCOUNTER — Ambulatory Visit: Payer: Self-pay | Admitting: Oncology

## 2013-04-05 ENCOUNTER — Emergency Department: Payer: Self-pay | Admitting: Emergency Medicine

## 2013-04-05 LAB — CBC
HCT: 28.7 % — ABNORMAL LOW (ref 40.0–52.0)
HGB: 9.8 g/dL — ABNORMAL LOW (ref 13.0–18.0)
MCH: 28.6 pg (ref 26.0–34.0)
MCHC: 34 g/dL (ref 32.0–36.0)
MCV: 84 fL (ref 80–100)
RBC: 3.41 10*6/uL — ABNORMAL LOW (ref 4.40–5.90)
RDW: 13.6 % (ref 11.5–14.5)
WBC: 7.2 10*3/uL (ref 3.8–10.6)

## 2013-04-05 LAB — COMPREHENSIVE METABOLIC PANEL
Alkaline Phosphatase: 135 U/L (ref 50–136)
Anion Gap: 9 (ref 7–16)
BUN: 15 mg/dL (ref 7–18)
Co2: 24 mmol/L (ref 21–32)
EGFR (African American): 48 — ABNORMAL LOW
EGFR (Non-African Amer.): 42 — ABNORMAL LOW
Glucose: 208 mg/dL — ABNORMAL HIGH (ref 65–99)
Osmolality: 279 (ref 275–301)
SGOT(AST): 73 U/L — ABNORMAL HIGH (ref 15–37)

## 2013-04-10 ENCOUNTER — Inpatient Hospital Stay: Payer: Self-pay | Admitting: Internal Medicine

## 2013-04-10 LAB — BASIC METABOLIC PANEL
Anion Gap: 9 (ref 7–16)
Calcium, Total: 8.5 mg/dL (ref 8.5–10.1)
Chloride: 103 mmol/L (ref 98–107)
Co2: 23 mmol/L (ref 21–32)
EGFR (Non-African Amer.): 29 — ABNORMAL LOW
Glucose: 441 mg/dL — ABNORMAL HIGH (ref 65–99)
Osmolality: 294 (ref 275–301)
Potassium: 4.9 mmol/L (ref 3.5–5.1)
Sodium: 135 mmol/L — ABNORMAL LOW (ref 136–145)

## 2013-04-10 LAB — COMPREHENSIVE METABOLIC PANEL
Albumin: 3.3 g/dL — ABNORMAL LOW (ref 3.4–5.0)
Alkaline Phosphatase: 138 U/L — ABNORMAL HIGH (ref 50–136)
BUN: 23 mg/dL — ABNORMAL HIGH (ref 7–18)
Chloride: 97 mmol/L — ABNORMAL LOW (ref 98–107)
Co2: 20 mmol/L — ABNORMAL LOW (ref 21–32)
Glucose: 662 mg/dL (ref 65–99)
Potassium: 5.4 mmol/L — ABNORMAL HIGH (ref 3.5–5.1)
SGOT(AST): 82 U/L — ABNORMAL HIGH (ref 15–37)
Sodium: 128 mmol/L — ABNORMAL LOW (ref 136–145)
Total Protein: 6.8 g/dL (ref 6.4–8.2)

## 2013-04-10 LAB — CBC
HCT: 31.6 % — ABNORMAL LOW (ref 40.0–52.0)
HGB: 10.2 g/dL — ABNORMAL LOW (ref 13.0–18.0)
MCV: 86 fL (ref 80–100)
Platelet: 252 10*3/uL (ref 150–440)
RDW: 14.2 % (ref 11.5–14.5)
WBC: 4.3 10*3/uL (ref 3.8–10.6)

## 2013-04-10 LAB — LIPASE, BLOOD: Lipase: 29 U/L — ABNORMAL LOW (ref 73–393)

## 2013-04-10 LAB — RAPID HIV-1/2 QL/CONFIRM: HIV-1/2,Rapid Ql: NEGATIVE

## 2013-04-10 LAB — CK: CK, Total: 57 U/L (ref 35–232)

## 2013-04-11 LAB — CBC WITH DIFFERENTIAL/PLATELET
Basophil #: 0.1 10*3/uL (ref 0.0–0.1)
Eosinophil #: 0.2 10*3/uL (ref 0.0–0.7)
Eosinophil %: 3.6 %
HGB: 9.2 g/dL — ABNORMAL LOW (ref 13.0–18.0)
MCH: 28.3 pg (ref 26.0–34.0)
MCHC: 32.7 g/dL (ref 32.0–36.0)
Monocyte #: 0.4 x10 3/mm (ref 0.2–1.0)
Monocyte %: 7.7 %
Neutrophil #: 1.6 10*3/uL (ref 1.4–6.5)
Neutrophil %: 30.5 %
RBC: 3.24 10*6/uL — ABNORMAL LOW (ref 4.40–5.90)
RDW: 13.8 % (ref 11.5–14.5)

## 2013-04-11 LAB — URINALYSIS, COMPLETE
Bacteria: NONE SEEN
Blood: NEGATIVE
Leukocyte Esterase: NEGATIVE
Nitrite: NEGATIVE
Ph: 5 (ref 4.5–8.0)
RBC,UR: <1 /HPF (ref 0–5)
Specific Gravity: 1.009 (ref 1.003–1.030)

## 2013-04-11 LAB — TROPONIN I: Troponin-I: <0.02 ng/mL

## 2013-04-14 LAB — BASIC METABOLIC PANEL WITH GFR
Anion Gap: 5 — ABNORMAL LOW
BUN: 15 mg/dL
Calcium, Total: 7.9 mg/dL — ABNORMAL LOW
Chloride: 113 mmol/L — ABNORMAL HIGH
Co2: 24 mmol/L
Creatinine: 2.08 mg/dL — ABNORMAL HIGH
EGFR (African American): 45 — ABNORMAL LOW
EGFR (Non-African Amer.): 39 — ABNORMAL LOW
Glucose: 290 mg/dL — ABNORMAL HIGH
Osmolality: 295
Potassium: 5.3 mmol/L — ABNORMAL HIGH
Sodium: 142 mmol/L

## 2013-04-14 LAB — STOOL CULTURE

## 2013-04-14 LAB — PLATELET COUNT: Platelet: 195 10*3/uL (ref 150–440)

## 2013-04-15 LAB — CULTURE, BLOOD (SINGLE)

## 2013-04-22 ENCOUNTER — Emergency Department: Payer: Self-pay | Admitting: Emergency Medicine

## 2013-04-22 LAB — URINALYSIS, COMPLETE
Bacteria: NONE SEEN
Blood: NEGATIVE
Glucose,UR: >=500 mg/dL (ref 0–75)
Nitrite: NEGATIVE
RBC,UR: <1 /HPF (ref 0–5)
Squamous Epithelial: <1
WBC UR: <1 /HPF (ref 0–5)

## 2013-04-22 LAB — CBC
HCT: 29.3 % — ABNORMAL LOW (ref 40.0–52.0)
MCH: 27.7 pg (ref 26.0–34.0)
MCHC: 32.6 g/dL (ref 32.0–36.0)
MCV: 85 fL (ref 80–100)
RBC: 3.45 10*6/uL — ABNORMAL LOW (ref 4.40–5.90)
RDW: 13.7 % (ref 11.5–14.5)
WBC: 5.2 10*3/uL (ref 3.8–10.6)

## 2013-04-22 LAB — COMPREHENSIVE METABOLIC PANEL
Albumin: 3.1 g/dL — ABNORMAL LOW (ref 3.4–5.0)
BUN: 18 mg/dL (ref 7–18)
Calcium, Total: 8.7 mg/dL (ref 8.5–10.1)
Creatinine: 1.79 mg/dL — ABNORMAL HIGH (ref 0.60–1.30)
EGFR (African American): 54 — ABNORMAL LOW
Glucose: 342 mg/dL — ABNORMAL HIGH (ref 65–99)
Potassium: 4.7 mmol/L (ref 3.5–5.1)
SGOT(AST): 77 U/L — ABNORMAL HIGH (ref 15–37)
Sodium: 134 mmol/L — ABNORMAL LOW (ref 136–145)
Total Protein: 7 g/dL (ref 6.4–8.2)

## 2013-04-23 ENCOUNTER — Inpatient Hospital Stay: Payer: Self-pay | Admitting: Internal Medicine

## 2013-04-23 LAB — COMPREHENSIVE METABOLIC PANEL
Albumin: 3.7 g/dL (ref 3.4–5.0)
Anion Gap: 23 — ABNORMAL HIGH (ref 7–16)
Calcium, Total: 8.8 mg/dL (ref 8.5–10.1)
Co2: 10 mmol/L — CL (ref 21–32)
Glucose: 844 mg/dL (ref 65–99)
Osmolality: 292 (ref 275–301)
Potassium: 5.1 mmol/L (ref 3.5–5.1)
SGPT (ALT): 60 U/L (ref 12–78)
Total Protein: 8.3 g/dL — ABNORMAL HIGH (ref 6.4–8.2)

## 2013-04-23 LAB — URINALYSIS, COMPLETE
Bacteria: NONE SEEN
Glucose,UR: >=500 mg/dL (ref 0–75)
Ph: 6 (ref 4.5–8.0)
Protein: NEGATIVE
Specific Gravity: 1.012 (ref 1.003–1.030)
WBC UR: <1 /HPF (ref 0–5)

## 2013-04-23 LAB — CBC WITH DIFFERENTIAL/PLATELET
Basophil #: 0 10*3/uL (ref 0.0–0.1)
Eosinophil %: 0 %
Lymphocyte %: 11.3 %
MCH: 28.2 pg (ref 26.0–34.0)
MCV: 91 fL (ref 80–100)
Monocyte #: 0.2 x10 3/mm (ref 0.2–1.0)
Monocyte %: 1.8 %
Neutrophil %: 86.5 %
Platelet: 318 10*3/uL (ref 150–440)
RDW: 14.5 % (ref 11.5–14.5)

## 2013-04-24 LAB — BASIC METABOLIC PANEL
Anion Gap: 12 (ref 7–16)
BUN: 26 mg/dL — ABNORMAL HIGH (ref 7–18)
BUN: 22 mg/dL — ABNORMAL HIGH (ref 7–18)
BUN: 21 mg/dL — ABNORMAL HIGH (ref 7–18)
Calcium, Total: 8.1 mg/dL — ABNORMAL LOW (ref 8.5–10.1)
Calcium, Total: 8 mg/dL — ABNORMAL LOW (ref 8.5–10.1)
Chloride: 108 mmol/L — ABNORMAL HIGH (ref 98–107)
Chloride: 106 mmol/L (ref 98–107)
Chloride: 107 mmol/L (ref 98–107)
Co2: 17 mmol/L — ABNORMAL LOW (ref 21–32)
Co2: 23 mmol/L (ref 21–32)
Creatinine: 2.1 mg/dL — ABNORMAL HIGH (ref 0.60–1.30)
Creatinine: 2.34 mg/dL — ABNORMAL HIGH (ref 0.60–1.30)
EGFR (African American): 36 — ABNORMAL LOW
EGFR (Non-African Amer.): 38 — ABNORMAL LOW
Glucose: 293 mg/dL — ABNORMAL HIGH (ref 65–99)
Glucose: 231 mg/dL — ABNORMAL HIGH (ref 65–99)
Osmolality: 285 (ref 275–301)
Osmolality: 280 (ref 275–301)
Potassium: 3.4 mmol/L — ABNORMAL LOW (ref 3.5–5.1)
Potassium: 4.5 mmol/L (ref 3.5–5.1)
Sodium: 137 mmol/L (ref 136–145)
Sodium: 135 mmol/L — ABNORMAL LOW (ref 136–145)

## 2013-04-24 LAB — CBC WITH DIFFERENTIAL/PLATELET
Basophil %: 0.7 %
Eosinophil %: 0.2 %
HGB: 9.2 g/dL — ABNORMAL LOW (ref 13.0–18.0)
Lymphocyte %: 30.9 %
MCH: 28.2 pg (ref 26.0–34.0)
MCHC: 33.5 g/dL (ref 32.0–36.0)
Monocyte %: 4.8 %
Neutrophil #: 6.2 10*3/uL (ref 1.4–6.5)
Neutrophil %: 63.4 %
RDW: 13.8 % (ref 11.5–14.5)
WBC: 9.8 10*3/uL (ref 3.8–10.6)

## 2013-04-24 LAB — LIPID PANEL
HDL Cholesterol: 70 mg/dL — ABNORMAL HIGH (ref 40–60)
Triglycerides: 141 mg/dL (ref 0–200)
VLDL Cholesterol, Calc: 28 mg/dL (ref 5–40)

## 2013-04-24 LAB — CLOSTRIDIUM DIFFICILE BY PCR

## 2013-04-25 LAB — BASIC METABOLIC PANEL
Anion Gap: 12 (ref 7–16)
Anion Gap: 8 (ref 7–16)
Calcium, Total: 8.1 mg/dL — ABNORMAL LOW (ref 8.5–10.1)
Calcium, Total: 7.6 mg/dL — ABNORMAL LOW (ref 8.5–10.1)
Chloride: 113 mmol/L — ABNORMAL HIGH (ref 98–107)
Chloride: 108 mmol/L — ABNORMAL HIGH (ref 98–107)
Chloride: 111 mmol/L — ABNORMAL HIGH (ref 98–107)
Co2: 22 mmol/L (ref 21–32)
Co2: 17 mmol/L — ABNORMAL LOW (ref 21–32)
Co2: 20 mmol/L — ABNORMAL LOW (ref 21–32)
Creatinine: 2.13 mg/dL — ABNORMAL HIGH (ref 0.60–1.30)
EGFR (African American): 38 — ABNORMAL LOW
EGFR (Non-African Amer.): 33 — ABNORMAL LOW
Glucose: 167 mg/dL — ABNORMAL HIGH (ref 65–99)
Glucose: 261 mg/dL — ABNORMAL HIGH (ref 65–99)
Osmolality: 287 (ref 275–301)
Osmolality: 290 (ref 275–301)
Potassium: 4.8 mmol/L (ref 3.5–5.1)
Potassium: 4.5 mmol/L (ref 3.5–5.1)
Potassium: 4.9 mmol/L (ref 3.5–5.1)

## 2013-04-25 LAB — CBC WITH DIFFERENTIAL/PLATELET
Basophil %: 0.4 %
Eosinophil #: 0.2 10*3/uL (ref 0.0–0.7)
Eosinophil %: 3.1 %
HCT: 28.2 % — ABNORMAL LOW (ref 40.0–52.0)
HGB: 9.4 g/dL — ABNORMAL LOW (ref 13.0–18.0)
Lymphocyte %: 47.3 %
MCH: 28.3 pg (ref 26.0–34.0)
MCHC: 33.2 g/dL (ref 32.0–36.0)
Monocyte #: 0.3 x10 3/mm (ref 0.2–1.0)
Monocyte %: 4.5 %
Neutrophil #: 3.3 10*3/uL (ref 1.4–6.5)
Platelet: 241 10*3/uL (ref 150–440)
RBC: 3.31 10*6/uL — ABNORMAL LOW (ref 4.40–5.90)
RDW: 14 % (ref 11.5–14.5)
WBC: 7.3 10*3/uL (ref 3.8–10.6)

## 2013-04-25 LAB — COMPREHENSIVE METABOLIC PANEL
Alkaline Phosphatase: 113 U/L (ref 50–136)
Anion Gap: 7 (ref 7–16)
Creatinine: 2.15 mg/dL — ABNORMAL HIGH (ref 0.60–1.30)
EGFR (African American): 43 — ABNORMAL LOW
Glucose: 214 mg/dL — ABNORMAL HIGH (ref 65–99)
Potassium: 4.4 mmol/L (ref 3.5–5.1)
SGOT(AST): 50 U/L — ABNORMAL HIGH (ref 15–37)
Sodium: 140 mmol/L (ref 136–145)

## 2013-04-26 LAB — CBC WITH DIFFERENTIAL/PLATELET
Basophil #: 0.1 10*3/uL (ref 0.0–0.1)
Eosinophil %: 3.5 %
HCT: 25.2 % — ABNORMAL LOW (ref 40.0–52.0)
HGB: 8.5 g/dL — ABNORMAL LOW (ref 13.0–18.0)
Lymphocyte #: 2.5 10*3/uL (ref 1.0–3.6)
Lymphocyte %: 43.4 %
MCV: 85 fL (ref 80–100)
Monocyte #: 0.3 x10 3/mm (ref 0.2–1.0)
Monocyte %: 5.7 %
Neutrophil #: 2.7 10*3/uL (ref 1.4–6.5)
Platelet: 220 10*3/uL (ref 150–440)
RBC: 2.97 10*6/uL — ABNORMAL LOW (ref 4.40–5.90)
RDW: 14 % (ref 11.5–14.5)

## 2013-04-27 LAB — COMPREHENSIVE METABOLIC PANEL
Alkaline Phosphatase: 97 U/L (ref 50–136)
Anion Gap: 2 — ABNORMAL LOW (ref 7–16)
BUN: 16 mg/dL (ref 7–18)
Bilirubin,Total: 0.1 mg/dL — ABNORMAL LOW (ref 0.2–1.0)
Calcium, Total: 7.3 mg/dL — ABNORMAL LOW (ref 8.5–10.1)
Co2: 28 mmol/L (ref 21–32)
EGFR (Non-African Amer.): 39 — ABNORMAL LOW
Osmolality: 283 (ref 275–301)
SGOT(AST): 52 U/L — ABNORMAL HIGH (ref 15–37)
SGPT (ALT): 42 U/L (ref 12–78)
Sodium: 142 mmol/L (ref 136–145)
Total Protein: 6.1 g/dL — ABNORMAL LOW (ref 6.4–8.2)

## 2013-04-27 LAB — CBC WITH DIFFERENTIAL/PLATELET
Basophil %: 0.8 %
Eosinophil #: 0.2 10*3/uL (ref 0.0–0.7)
HCT: 25.8 % — ABNORMAL LOW (ref 40.0–52.0)
Lymphocyte #: 3 10*3/uL (ref 1.0–3.6)
MCH: 28.1 pg (ref 26.0–34.0)
MCHC: 33.1 g/dL (ref 32.0–36.0)
MCV: 85 fL (ref 80–100)
Monocyte %: 5.7 %
Platelet: 221 10*3/uL (ref 150–440)
RBC: 3.04 10*6/uL — ABNORMAL LOW (ref 4.40–5.90)
RDW: 13.8 % (ref 11.5–14.5)

## 2013-04-28 LAB — BASIC METABOLIC PANEL
BUN: 22 mg/dL — ABNORMAL HIGH (ref 7–18)
Calcium, Total: 7.5 mg/dL — ABNORMAL LOW (ref 8.5–10.1)
Chloride: 110 mmol/L — ABNORMAL HIGH (ref 98–107)
Creatinine: 2.35 mg/dL — ABNORMAL HIGH (ref 0.60–1.30)
EGFR (African American): 39 — ABNORMAL LOW
Glucose: 259 mg/dL — ABNORMAL HIGH (ref 65–99)
Osmolality: 288 (ref 275–301)
Sodium: 138 mmol/L (ref 136–145)

## 2013-04-28 LAB — CULTURE, BLOOD (SINGLE)

## 2013-04-29 ENCOUNTER — Ambulatory Visit: Payer: Self-pay | Admitting: Oncology

## 2013-04-29 LAB — URINALYSIS, COMPLETE
Bacteria: NONE SEEN
Blood: NEGATIVE
Glucose,UR: 50 mg/dL (ref 0–75)
Ketone: NEGATIVE
Leukocyte Esterase: NEGATIVE
Protein: NEGATIVE
RBC,UR: NONE SEEN /HPF (ref 0–5)
WBC UR: NONE SEEN /HPF (ref 0–5)

## 2013-04-29 LAB — BASIC METABOLIC PANEL
Anion Gap: 3 — ABNORMAL LOW (ref 7–16)
BUN: 25 mg/dL — ABNORMAL HIGH (ref 7–18)
Calcium, Total: 8.2 mg/dL — ABNORMAL LOW (ref 8.5–10.1)
Chloride: 109 mmol/L — ABNORMAL HIGH (ref 98–107)
Co2: 28 mmol/L (ref 21–32)
EGFR (African American): 45 — ABNORMAL LOW
Osmolality: 283 (ref 275–301)
Potassium: 4.8 mmol/L (ref 3.5–5.1)
Sodium: 140 mmol/L (ref 136–145)

## 2013-04-29 LAB — CBC WITH DIFFERENTIAL/PLATELET
Basophil #: 0.1 10*3/uL (ref 0.0–0.1)
Basophil %: 0.6 %
Eosinophil %: 1.1 %
HGB: 9.3 g/dL — ABNORMAL LOW (ref 13.0–18.0)
Lymphocyte #: 1.7 10*3/uL (ref 1.0–3.6)
Lymphocyte %: 12.9 %
MCH: 27.6 pg (ref 26.0–34.0)
MCHC: 32.6 g/dL (ref 32.0–36.0)
MCV: 85 fL (ref 80–100)
Monocyte %: 3.3 %
Platelet: 287 10*3/uL (ref 150–440)
RBC: 3.35 10*6/uL — ABNORMAL LOW (ref 4.40–5.90)

## 2013-04-29 LAB — HEMOGLOBIN A1C: Hemoglobin A1C: 9 % — ABNORMAL HIGH (ref 4.2–6.3)

## 2013-05-01 LAB — BASIC METABOLIC PANEL
Chloride: 102 mmol/L (ref 98–107)
Co2: 27 mmol/L (ref 21–32)
Creatinine: 2.21 mg/dL — ABNORMAL HIGH (ref 0.60–1.30)
EGFR (Non-African Amer.): 36 — ABNORMAL LOW
Glucose: 454 mg/dL — ABNORMAL HIGH (ref 65–99)
Potassium: 6.2 mmol/L — ABNORMAL HIGH (ref 3.5–5.1)

## 2013-05-04 LAB — CULTURE, BLOOD (SINGLE)

## 2013-05-24 ENCOUNTER — Inpatient Hospital Stay: Payer: Self-pay | Admitting: Internal Medicine

## 2013-05-24 LAB — DRUG SCREEN, URINE
Amphetamines, Ur Screen: NEGATIVE (ref ?–1000)
Barbiturates, Ur Screen: NEGATIVE (ref ?–200)
Benzodiazepine, Ur Scrn: NEGATIVE (ref ?–200)
Cannabinoid 50 Ng, Ur ~~LOC~~: NEGATIVE (ref ?–50)
Phencyclidine (PCP) Ur S: NEGATIVE (ref ?–25)
Tricyclic, Ur Screen: NEGATIVE (ref ?–1000)

## 2013-05-24 LAB — URINALYSIS, COMPLETE
Bacteria: NONE SEEN
Glucose,UR: >=500 mg/dL (ref 0–75)
Ketone: NEGATIVE
Leukocyte Esterase: NEGATIVE
Nitrite: NEGATIVE
Ph: 5 (ref 4.5–8.0)
RBC,UR: <1 /HPF (ref 0–5)
Specific Gravity: 1.005 (ref 1.003–1.030)
WBC UR: <1 /HPF (ref 0–5)

## 2013-05-24 LAB — COMPREHENSIVE METABOLIC PANEL
Anion Gap: 10 (ref 7–16)
Bilirubin,Total: 0.2 mg/dL (ref 0.2–1.0)
Chloride: 110 mmol/L — ABNORMAL HIGH (ref 98–107)
Creatinine: 2.23 mg/dL — ABNORMAL HIGH (ref 0.60–1.30)
EGFR (Non-African Amer.): 36 — ABNORMAL LOW
Glucose: 104 mg/dL — ABNORMAL HIGH (ref 65–99)
Osmolality: 279 (ref 275–301)
Potassium: 3 mmol/L — ABNORMAL LOW (ref 3.5–5.1)
SGOT(AST): 58 U/L — ABNORMAL HIGH (ref 15–37)
SGPT (ALT): 37 U/L (ref 12–78)
Total Protein: 7.6 g/dL (ref 6.4–8.2)

## 2013-05-24 LAB — BASIC METABOLIC PANEL
Anion Gap: 11 (ref 7–16)
BUN: 20 mg/dL — ABNORMAL HIGH (ref 7–18)
Calcium, Total: 8.3 mg/dL — ABNORMAL LOW (ref 8.5–10.1)
Chloride: 106 mmol/L (ref 98–107)
Co2: 20 mmol/L — ABNORMAL LOW (ref 21–32)
Creatinine: 2.04 mg/dL — ABNORMAL HIGH (ref 0.60–1.30)
EGFR (African American): 46 — ABNORMAL LOW
Osmolality: 282 (ref 275–301)
Potassium: 3.5 mmol/L (ref 3.5–5.1)
Sodium: 137 mmol/L (ref 136–145)

## 2013-05-24 LAB — CBC
HCT: 31.4 % — ABNORMAL LOW (ref 40.0–52.0)
MCH: 27.7 pg (ref 26.0–34.0)
MCV: 82 fL (ref 80–100)
Platelet: 273 10*3/uL (ref 150–440)
RDW: 14.8 % — ABNORMAL HIGH (ref 11.5–14.5)
WBC: 7.6 10*3/uL (ref 3.8–10.6)

## 2013-05-24 LAB — ETHANOL: Ethanol: <3 mg/dL

## 2013-05-24 LAB — TROPONIN I: Troponin-I: <0.02 ng/mL

## 2013-05-24 LAB — ACETAMINOPHEN LEVEL: Acetaminophen: 80 ug/mL — ABNORMAL HIGH

## 2013-05-24 LAB — BETA-HYDROXYBUTYRIC ACID: Beta-Hydroxybutyrate: 0.96 mg/dL (ref 0.2–2.8)

## 2013-05-24 LAB — PROTIME-INR
INR: 1
Prothrombin Time: 13 secs (ref 11.5–14.7)

## 2013-05-24 LAB — TSH: Thyroid Stimulating Horm: 5.63 u[IU]/mL — ABNORMAL HIGH

## 2013-05-24 LAB — CALCIUM: Calcium, Total: 9.1 mg/dL (ref 8.5–10.1)

## 2013-05-24 LAB — CK TOTAL AND CKMB (NOT AT ARMC)
CK, Total: 136 U/L (ref 35–232)
CK-MB: 4 ng/mL — ABNORMAL HIGH (ref 0.5–3.6)

## 2013-05-24 LAB — SALICYLATE LEVEL: Salicylates, Serum: <1.7 mg/dL

## 2013-05-24 LAB — MAGNESIUM: Magnesium: 1.2 mg/dL — ABNORMAL LOW

## 2013-05-24 LAB — LIPASE, BLOOD: Lipase: 26 U/L — ABNORMAL LOW (ref 73–393)

## 2013-05-25 LAB — CBC WITH DIFFERENTIAL/PLATELET
Basophil #: 0.1 10*3/uL (ref 0.0–0.1)
Basophil %: 1 %
Eosinophil #: 0.1 10*3/uL (ref 0.0–0.7)
HCT: 31.2 % — ABNORMAL LOW (ref 40.0–52.0)
Lymphocyte #: 2 10*3/uL (ref 1.0–3.6)
Lymphocyte %: 22.3 %
MCH: 27.2 pg (ref 26.0–34.0)
Neutrophil #: 6.3 10*3/uL (ref 1.4–6.5)
RDW: 14.7 % — ABNORMAL HIGH (ref 11.5–14.5)
WBC: 9.1 10*3/uL (ref 3.8–10.6)

## 2013-05-25 LAB — MAGNESIUM: Magnesium: 1.9 mg/dL

## 2013-05-25 LAB — CK TOTAL AND CKMB (NOT AT ARMC)
CK, Total: 107 U/L (ref 35–232)
CK-MB: 3.3 ng/mL (ref 0.5–3.6)

## 2013-05-25 LAB — COMPREHENSIVE METABOLIC PANEL
Albumin: 3.1 g/dL — ABNORMAL LOW (ref 3.4–5.0)
Alkaline Phosphatase: 127 U/L (ref 50–136)
BUN: 17 mg/dL (ref 7–18)
Chloride: 106 mmol/L (ref 98–107)
Co2: 18 mmol/L — ABNORMAL LOW (ref 21–32)
Creatinine: 1.99 mg/dL — ABNORMAL HIGH (ref 0.60–1.30)
EGFR (Non-African Amer.): 41 — ABNORMAL LOW
Glucose: 223 mg/dL — ABNORMAL HIGH (ref 65–99)
Osmolality: 280 (ref 275–301)
Potassium: 3.4 mmol/L — ABNORMAL LOW (ref 3.5–5.1)
Total Protein: 6.7 g/dL (ref 6.4–8.2)

## 2013-05-25 LAB — TSH: Thyroid Stimulating Horm: 7.32 u[IU]/mL — ABNORMAL HIGH

## 2013-05-25 LAB — PROTIME-INR: Prothrombin Time: 14.4 secs (ref 11.5–14.7)

## 2013-05-25 LAB — TROPONIN I: Troponin-I: <0.02 ng/mL

## 2013-05-25 LAB — T4, FREE: Free Thyroxine: 0.95 ng/dL (ref 0.76–1.46)

## 2013-05-25 LAB — ACETAMINOPHEN LEVEL: Acetaminophen: 2 ug/mL — ABNORMAL LOW

## 2013-06-01 ENCOUNTER — Ambulatory Visit: Payer: Self-pay | Admitting: Oncology

## 2013-06-29 ENCOUNTER — Ambulatory Visit: Payer: Self-pay | Admitting: Oncology

## 2013-06-29 DEATH — deceased

## 2013-08-17 ENCOUNTER — Ambulatory Visit: Payer: Self-pay | Admitting: Oncology

## 2014-03-14 IMAGING — US US PELVIS LIMITED
1 series · 14 of 25 positions shown · non-contrast
Comparison: none

REASON FOR EXAM: swollen left testicle - painful
COMMENTS:

PROCEDURE:     US  - US TESTICULAR  - February 02, 2013  [DATE]
RESULT:     Scrotal ultrasound dated 02/02/2013
TECHNIQUE: Real-time sonographic imaging was performed of the scrotum
including color flow Doppler and spectral waveform imaging. Representative
static images were provided for interpretation.

[Series 1: us pelvis limited · 0.08mm/px · 14 of 69 slices shown]
[im 1/69]
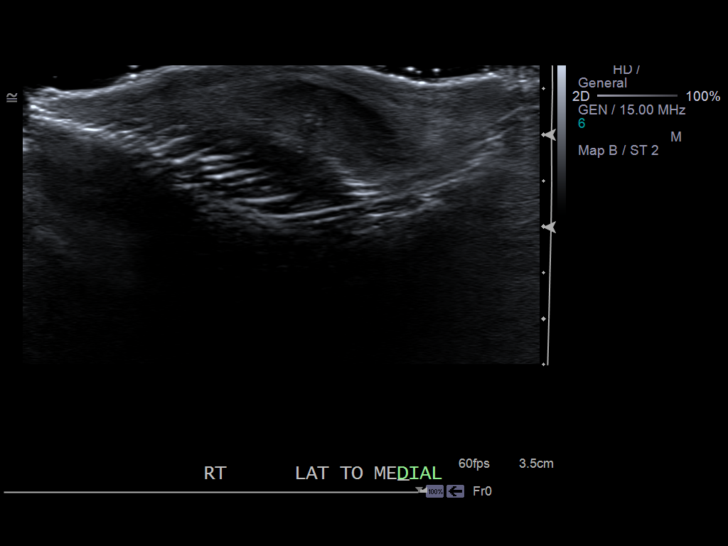
[im 6/69]
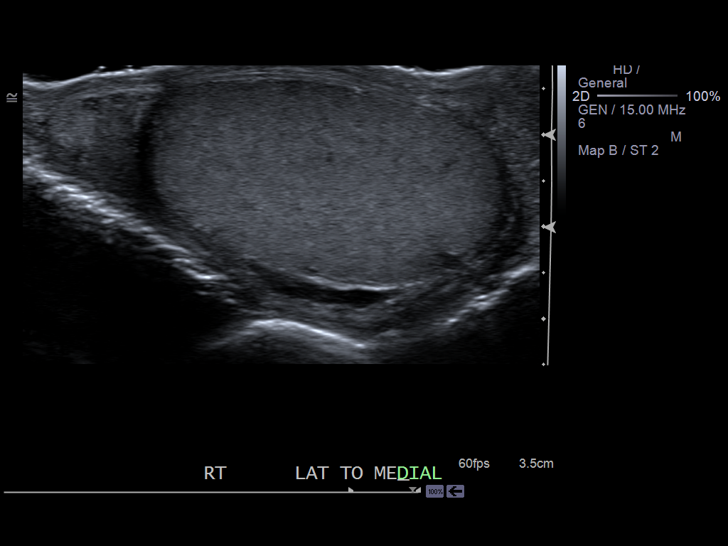
[im 12/69]
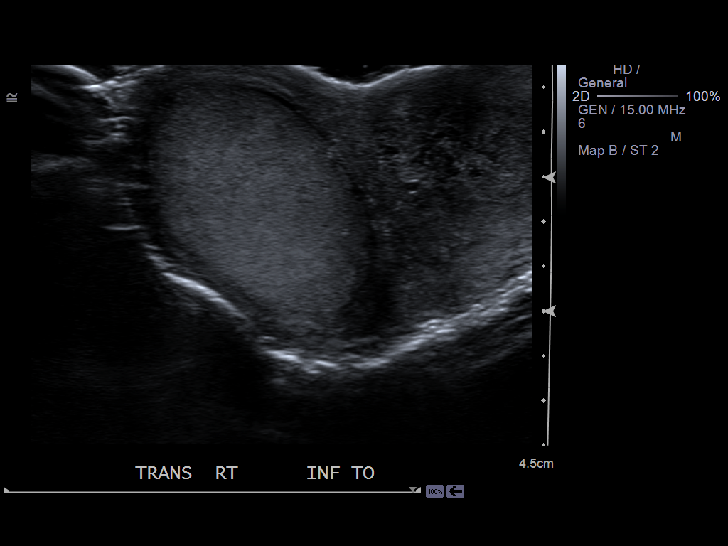
[im 18/69]
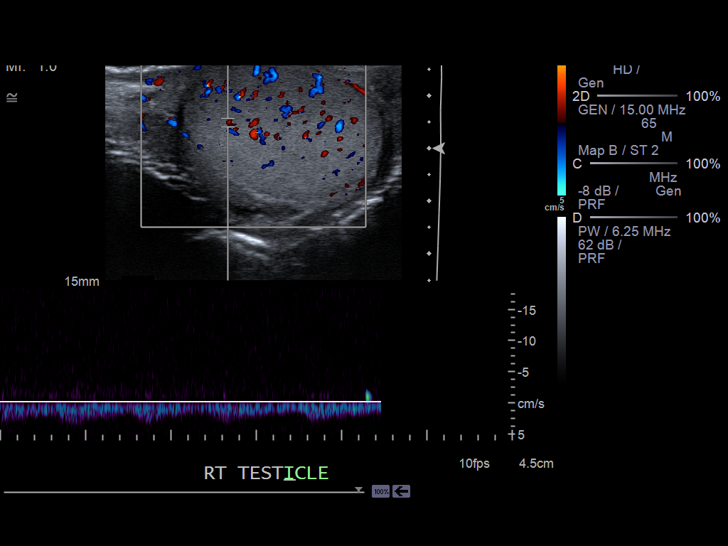
[im 23/69]
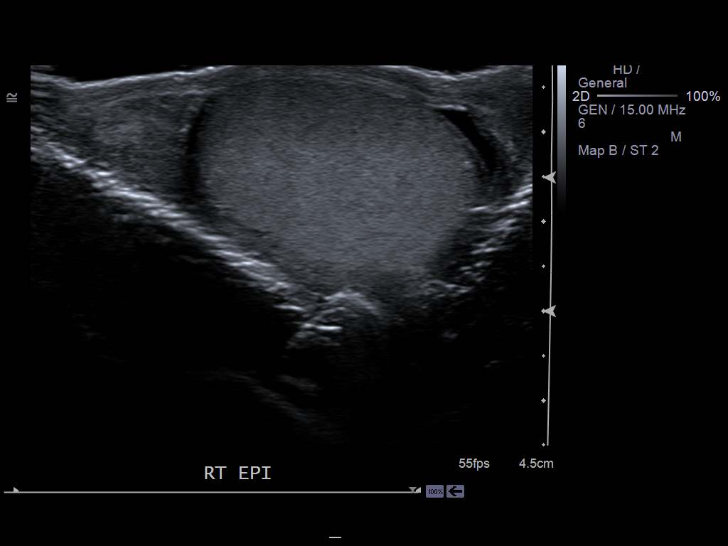
[im 26/69]
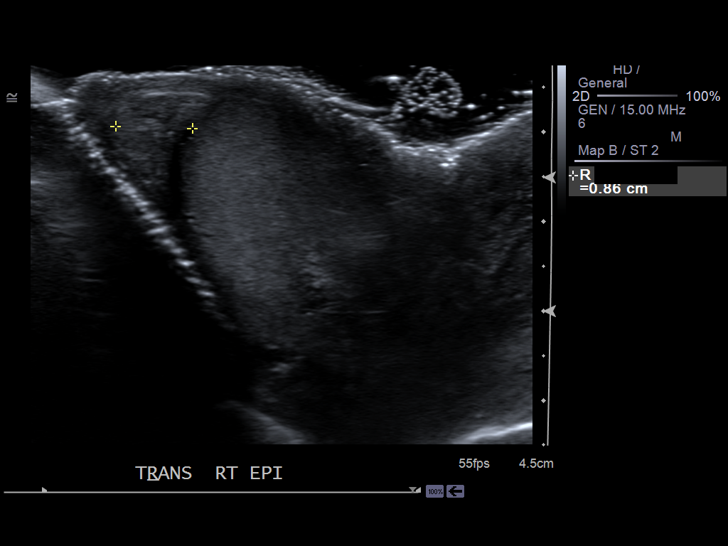
[im 32/69]
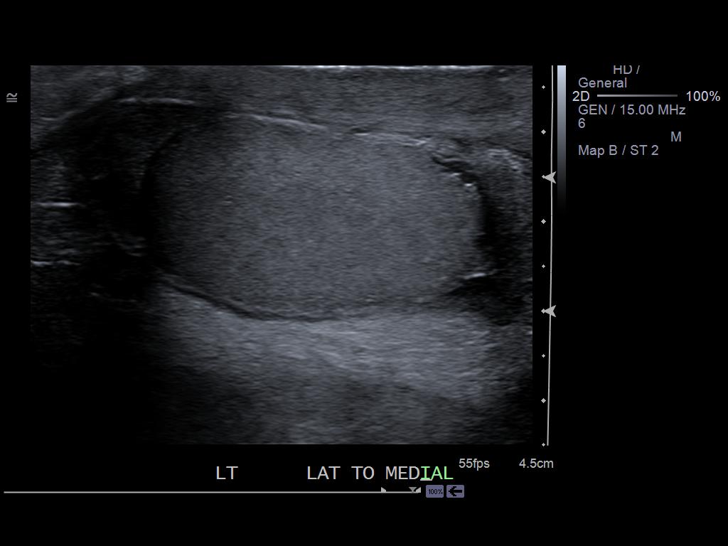
[im 37/69]
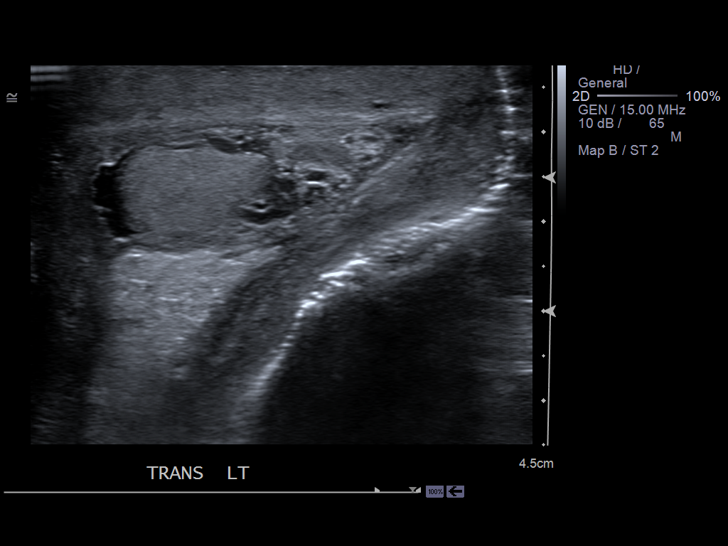
[im 43/69]
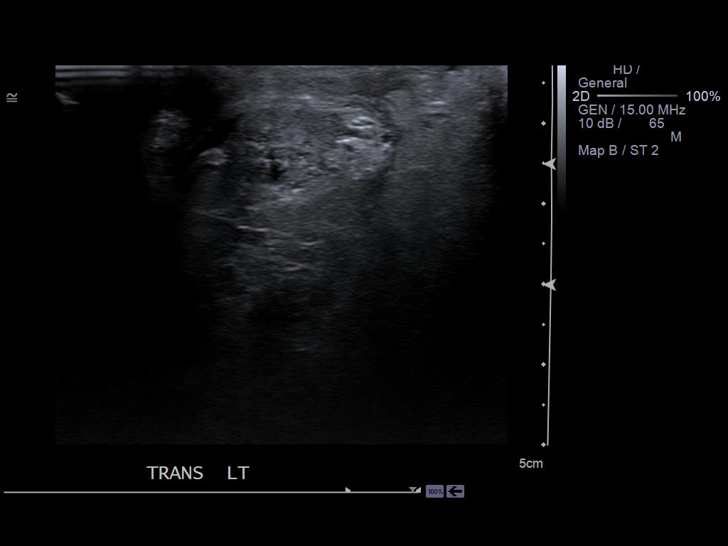
[im 46/69]
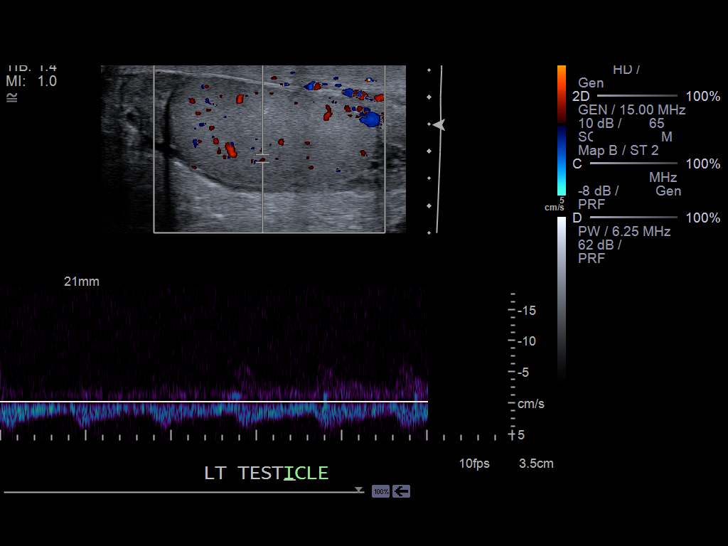
[im 52/69]
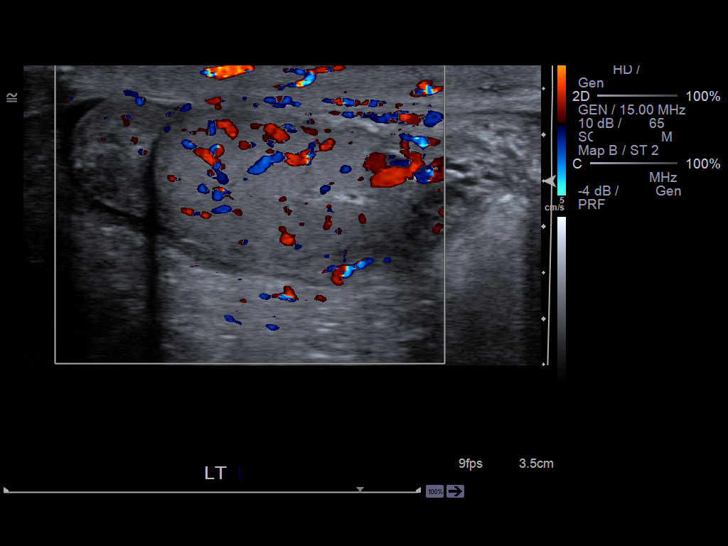
[im 57/69]
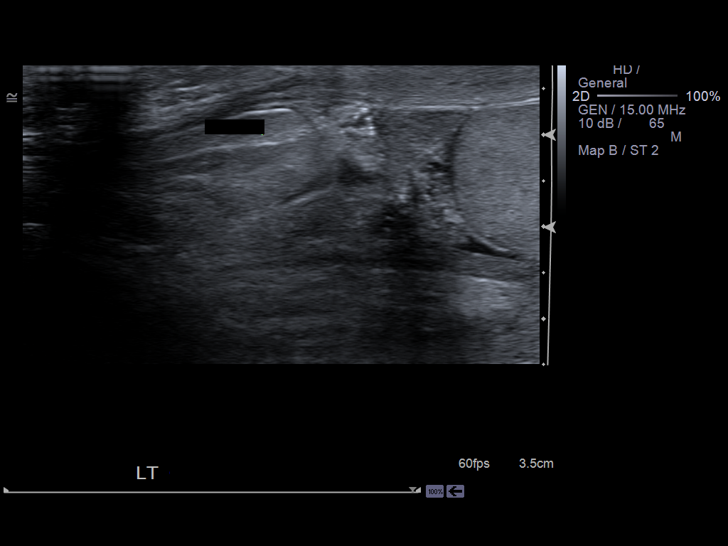
[im 63/69]
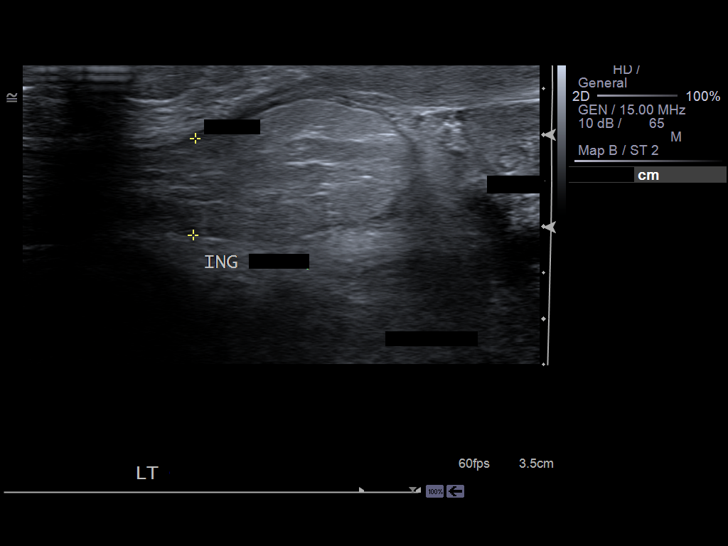
[im 69/69]
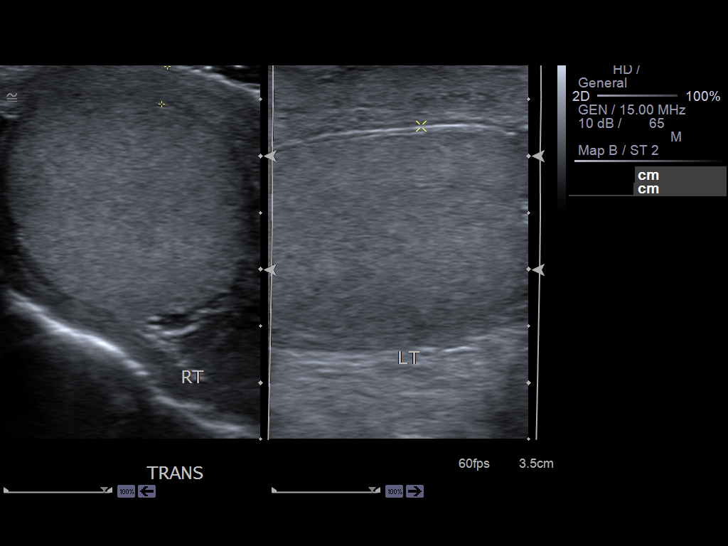

[14 of 25 positions shown; findings below may reference images not displayed]

FINDINGS: Diffuse skin thickening of the scrotum is appreciated left greater
than right.

The right testicle measures 3.39 x 2.48 x 2.12 cm and the left 4.85 x 1.3 x
2.35 cm. The testicles demonstrate homogeneous symmetric architecture as
well as diffuse symmetric vascularity. The right epididymis measures 1.08 x
1.04 x 0.86 cm and the left 0.79 x 1.14 x 1.4 cm. Symmetric vascularity is
identified within the epididymides which  are otherwise unremarkable. There
is no evidence of a varicocele nor hydrocele on the right or left.

A sliding hiatal hernia is identified within the inguinal canal on the left.
IMPRESSION: 1. Left inguinal hernia
2. Findings which may represent a component of cellulitis involving the
scrotum if clinically appropriate.
3. No further abnormalities.

## 2014-05-20 IMAGING — CR DG CHEST 2V
1 series · 2 of 2 positions shown · non-contrast
Comparison: none

REASON FOR EXAM: sob
COMMENTS:

PROCEDURE:     DXR - DXR CHEST PA (OR AP) AND LATERAL  - April 10, 2013  [DATE]
RESULT:     The lungs are clear. The cardiac silhouette and visualized bony
skeleton are unremarkable. A left-sided central venous catheter is
appreciated with tip at the level of the vena cava.

[Series 1: w chest pa · 0.14mm/px · 2 of 2 slices shown]
[im 1/2]
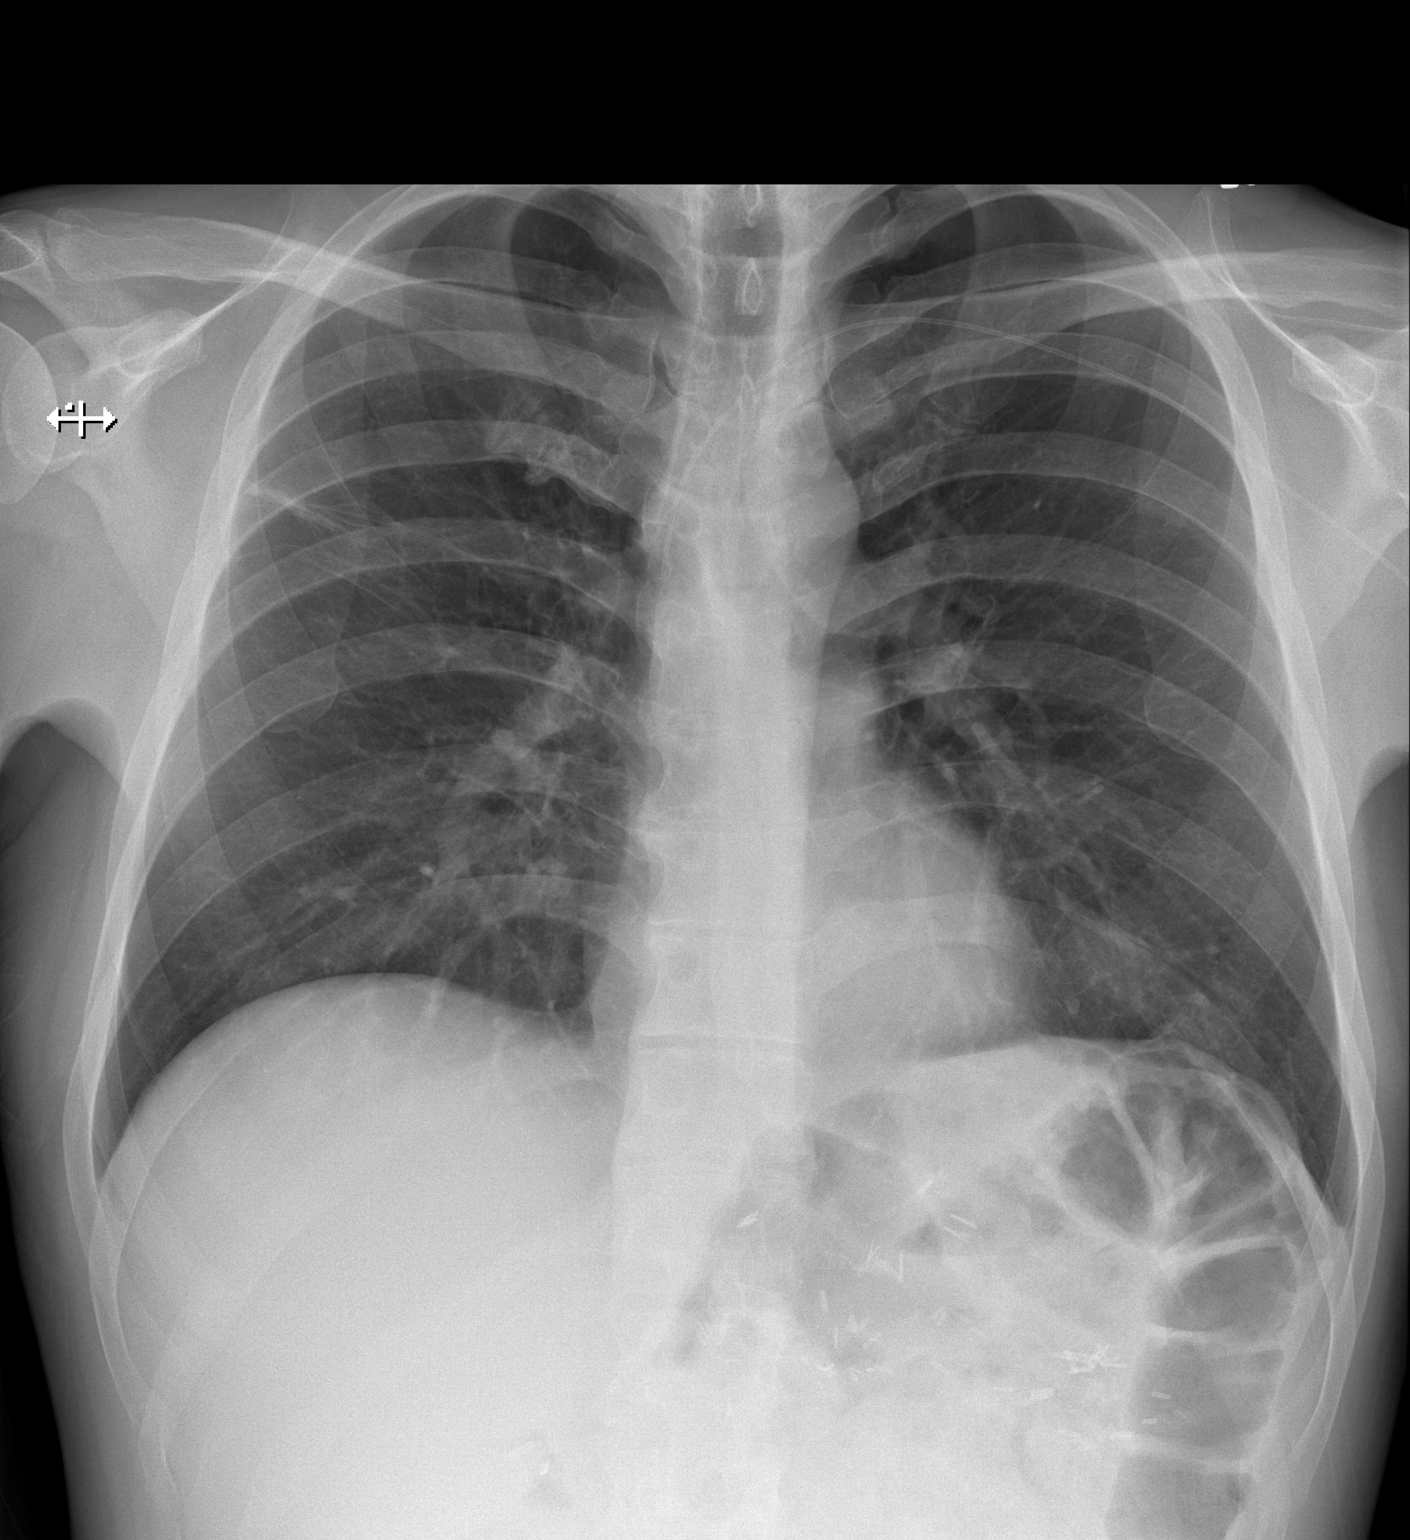
[im 2/2]
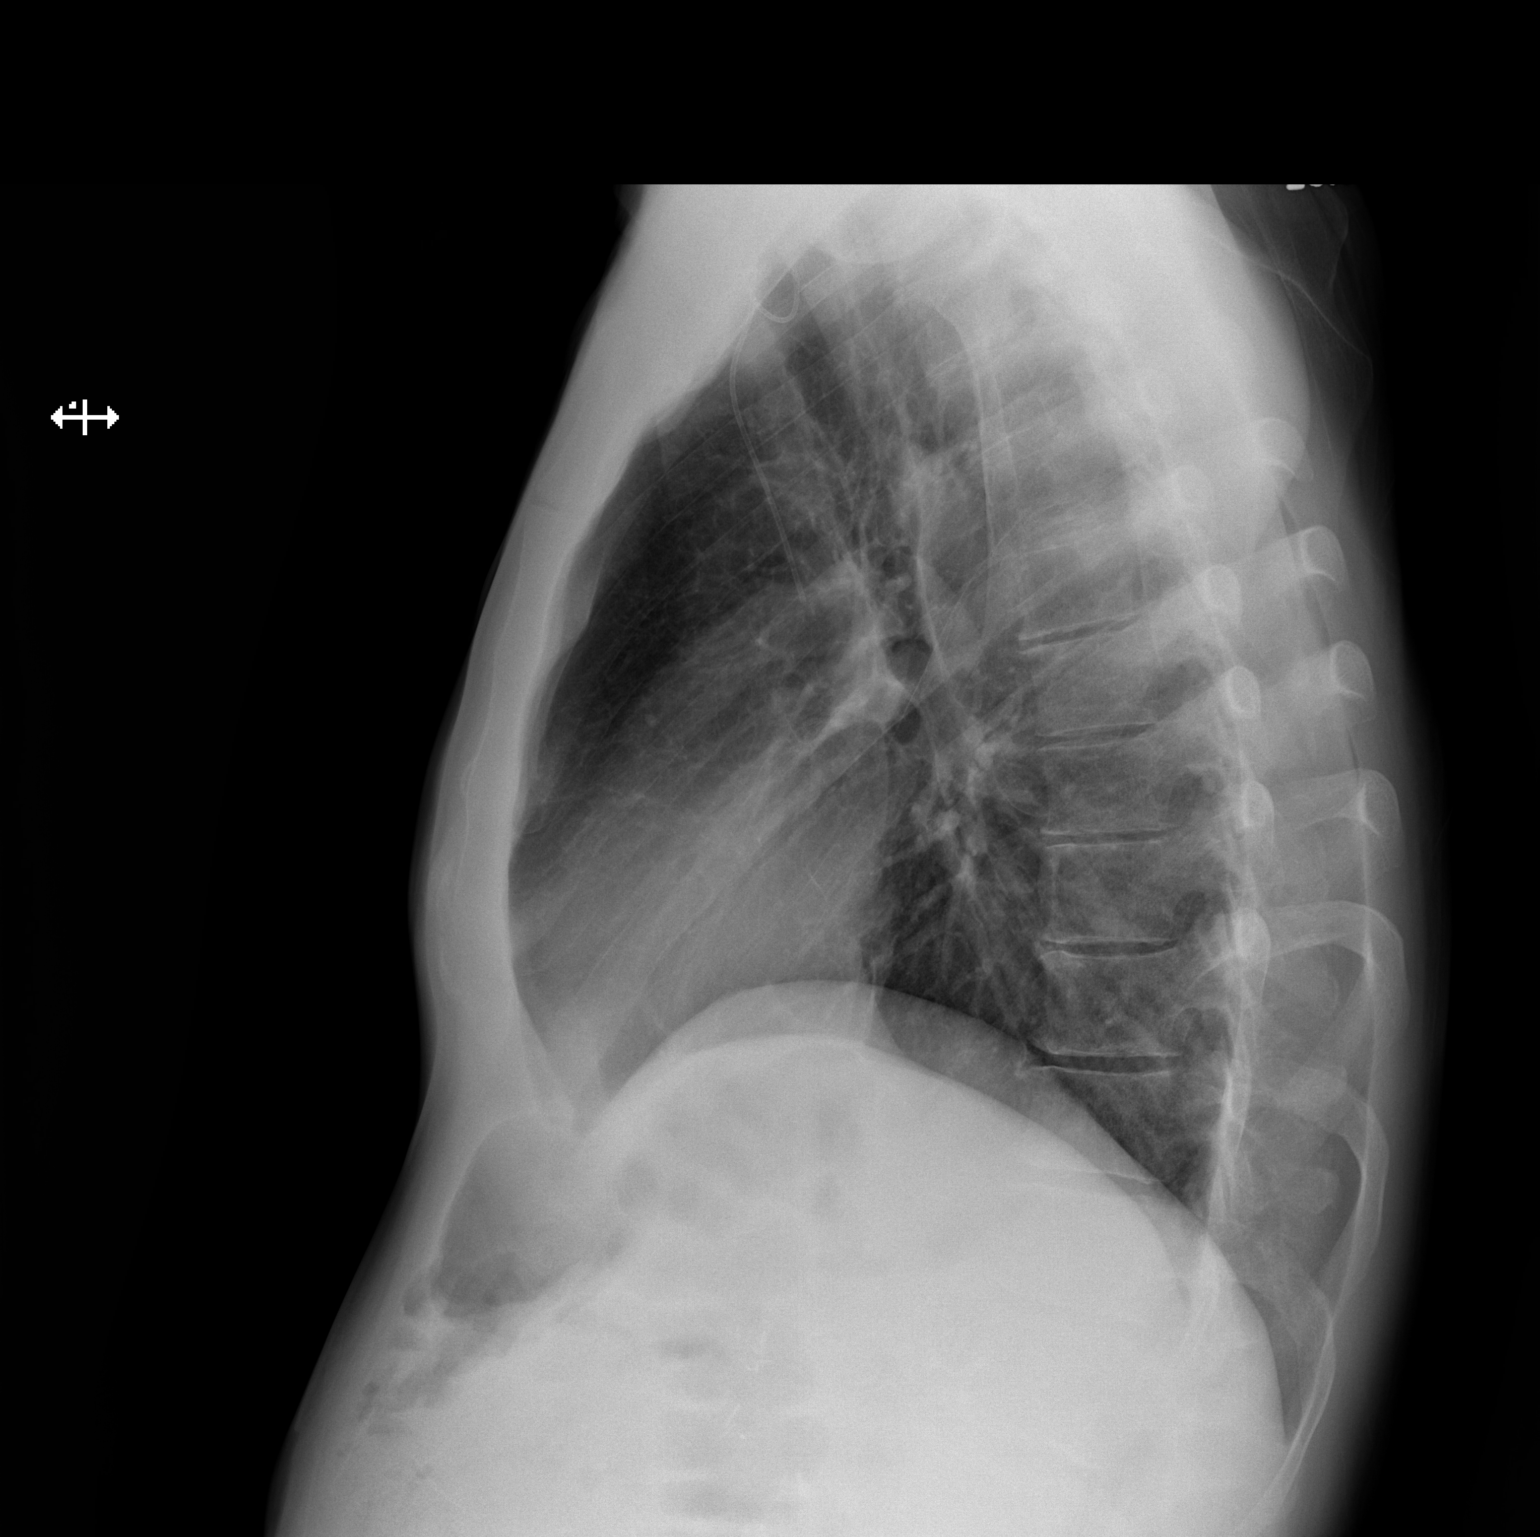

[2 of 2 positions shown; findings below may reference images not displayed]

IMPRESSION: 1. Chest radiograph without evidence of acute cardiopulmonary disease.
2. Comparison made to prior dated 01/27/2013

## 2014-06-03 IMAGING — CR DG CHEST 2V
1 series · 2 of 2 positions shown · non-contrast
Comparison: none

REASON FOR EXAM: Hypoxia
COMMENTS:

PROCEDURE:     DXR - DXR CHEST PA (OR AP) AND LATERAL  - April 24, 2013 [DATE]
RESULT:
Comparison is made to a prior study dated 04/10/2013.

[Series 1: pa · 0.17mm/px · 2 of 2 slices shown]
[im 1/2]
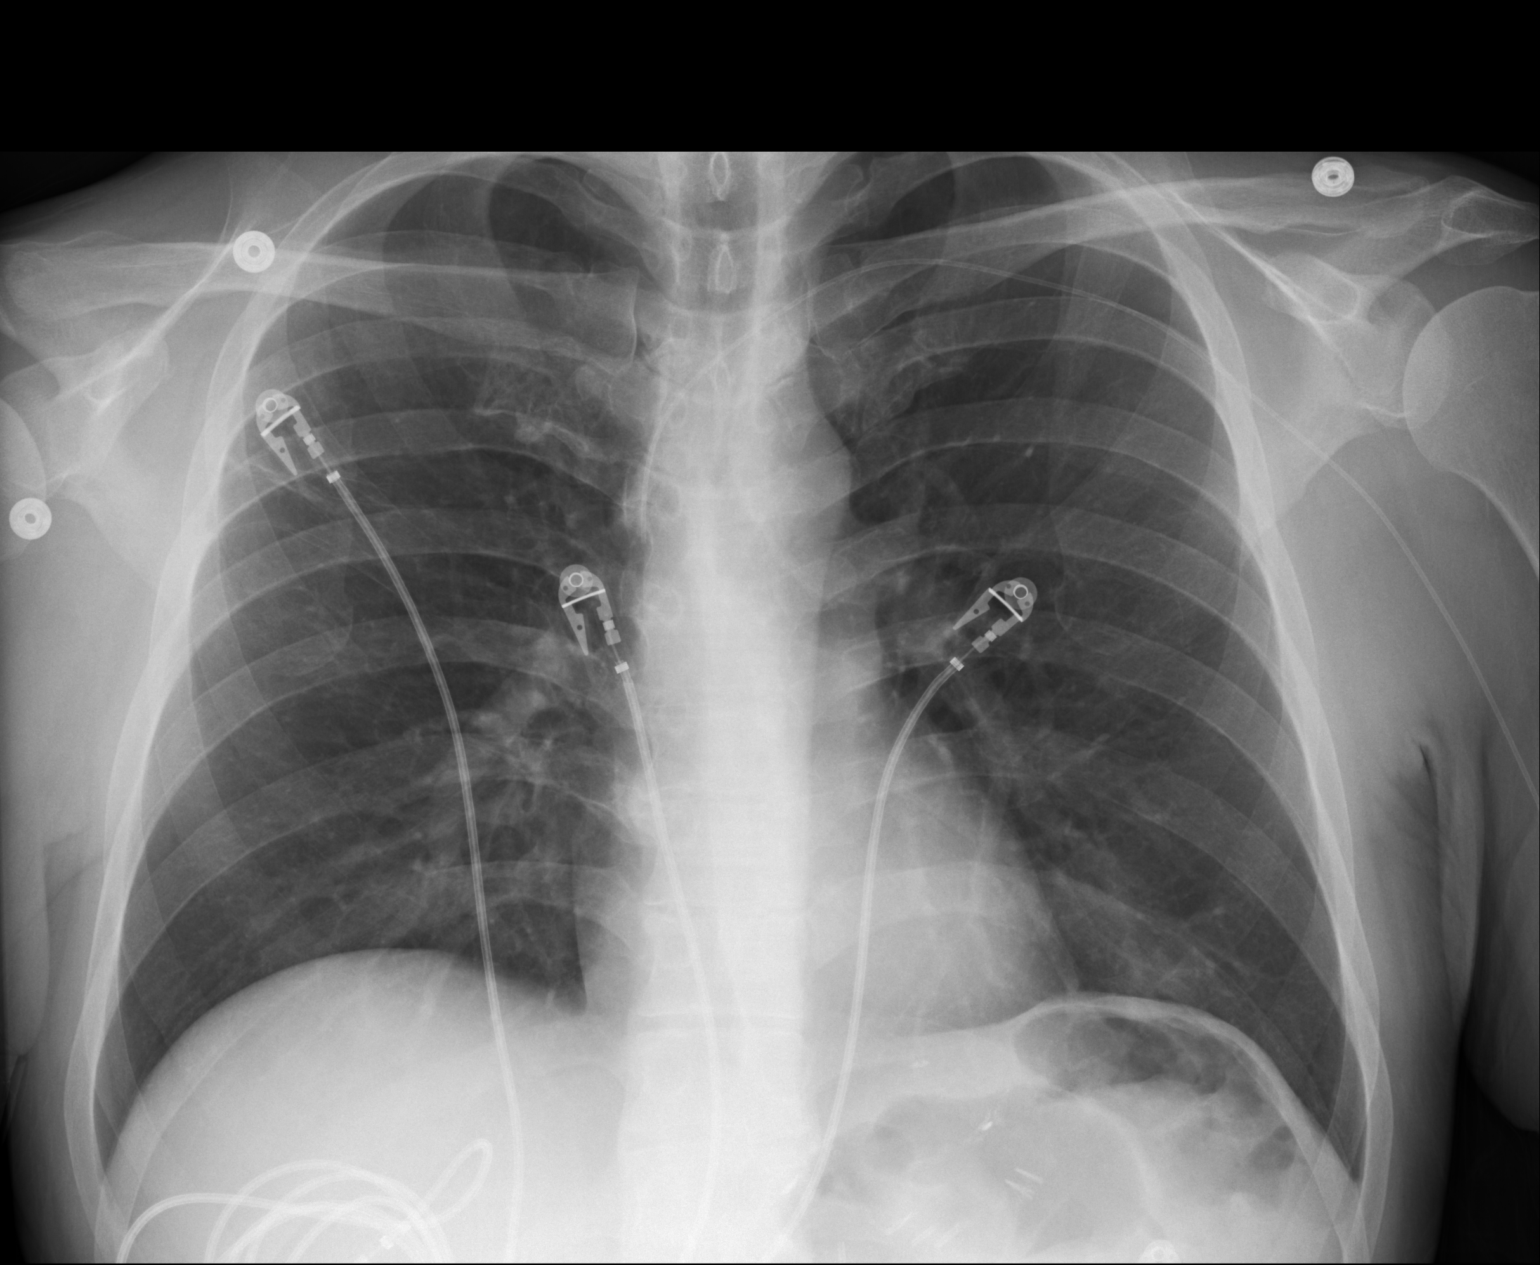
[im 2/2]
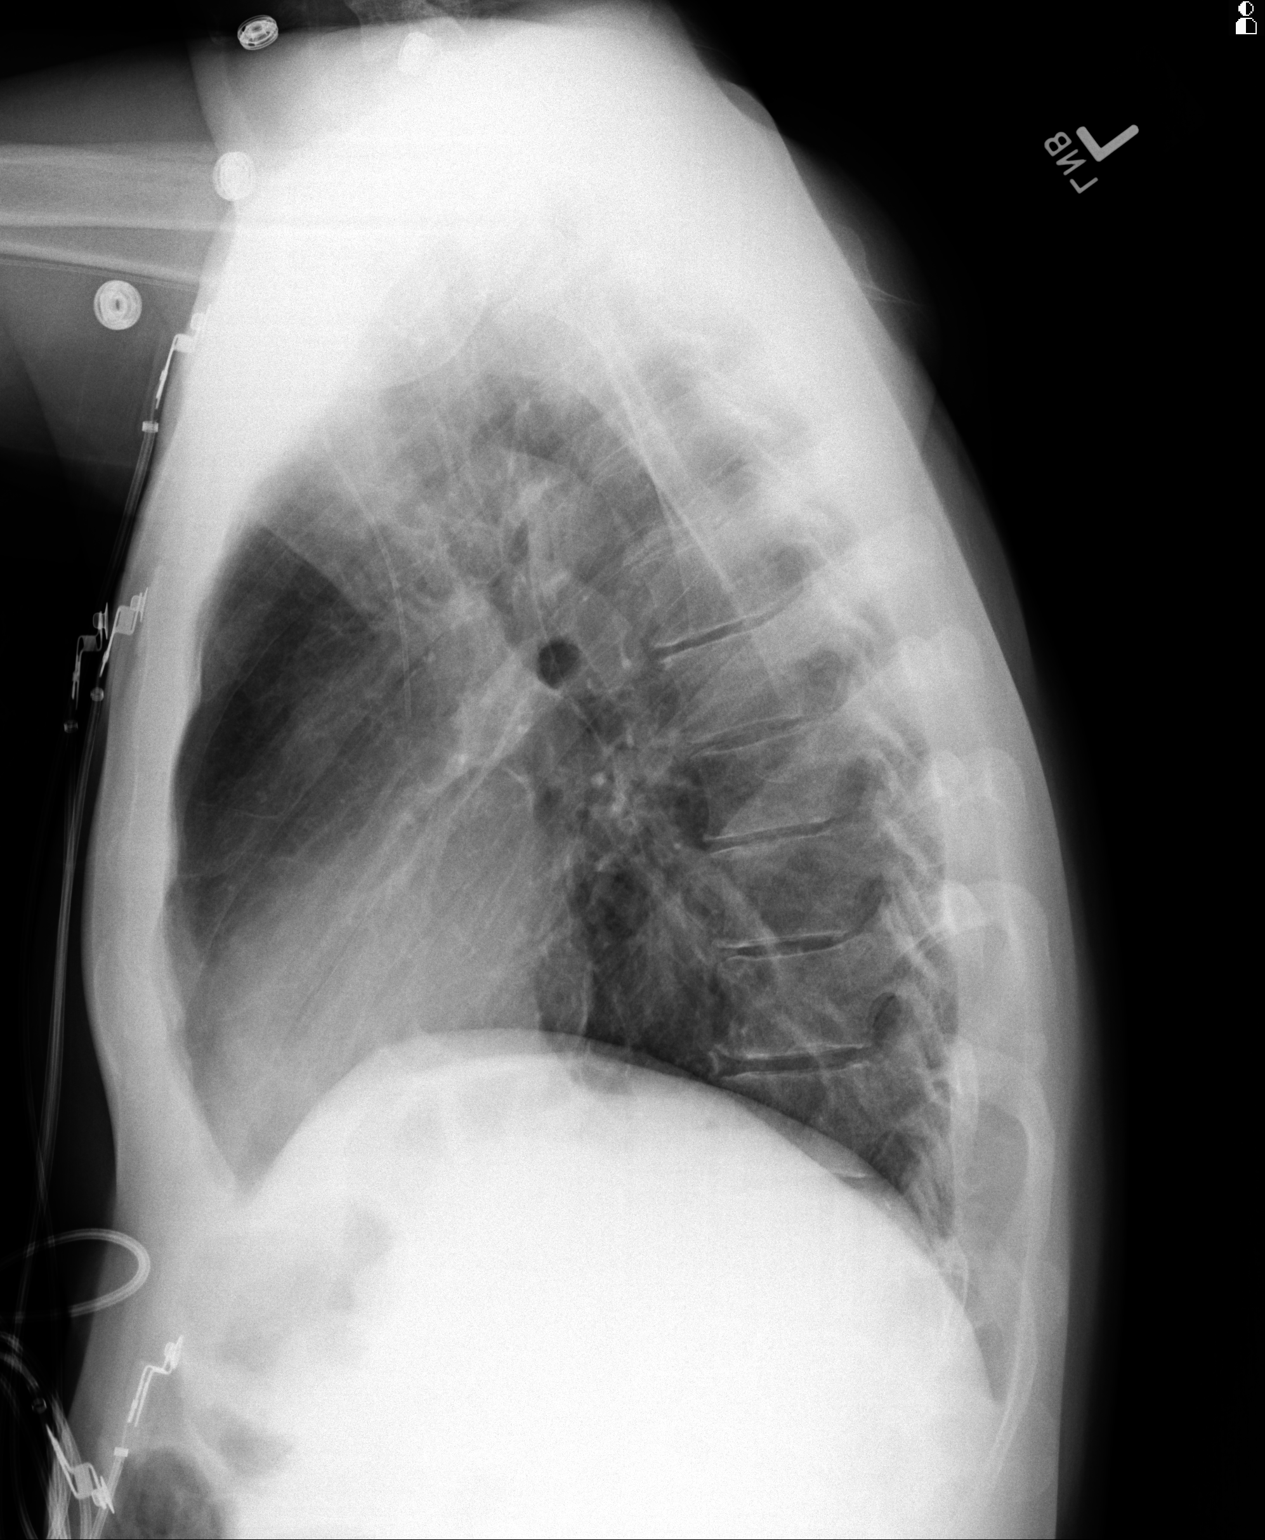

[2 of 2 positions shown; findings below may reference images not displayed]

FINDINGS: There is no evidence of focal infiltrates, effusions or edema. A
left sided central venous catheter is appreciated with the tip projecting in
the region of the superior vena cava. Linear areas of increased density
project along the periphery of the right hemithorax likely representing
fibrosis. The cardiac silhouette and visualized bony skeleton is
unremarkable.
IMPRESSION: Chest radiograph without evidence of acute cardiopulmonary
disease.

## 2014-06-08 IMAGING — CR DG CHEST 2V
1 series · 2 of 2 positions shown · non-contrast
Comparison: none

REASON FOR EXAM: fever
COMMENTS:

[Series 1: w chest pa · 0.14mm/px · 2 of 2 slices shown]
[im 1/2]
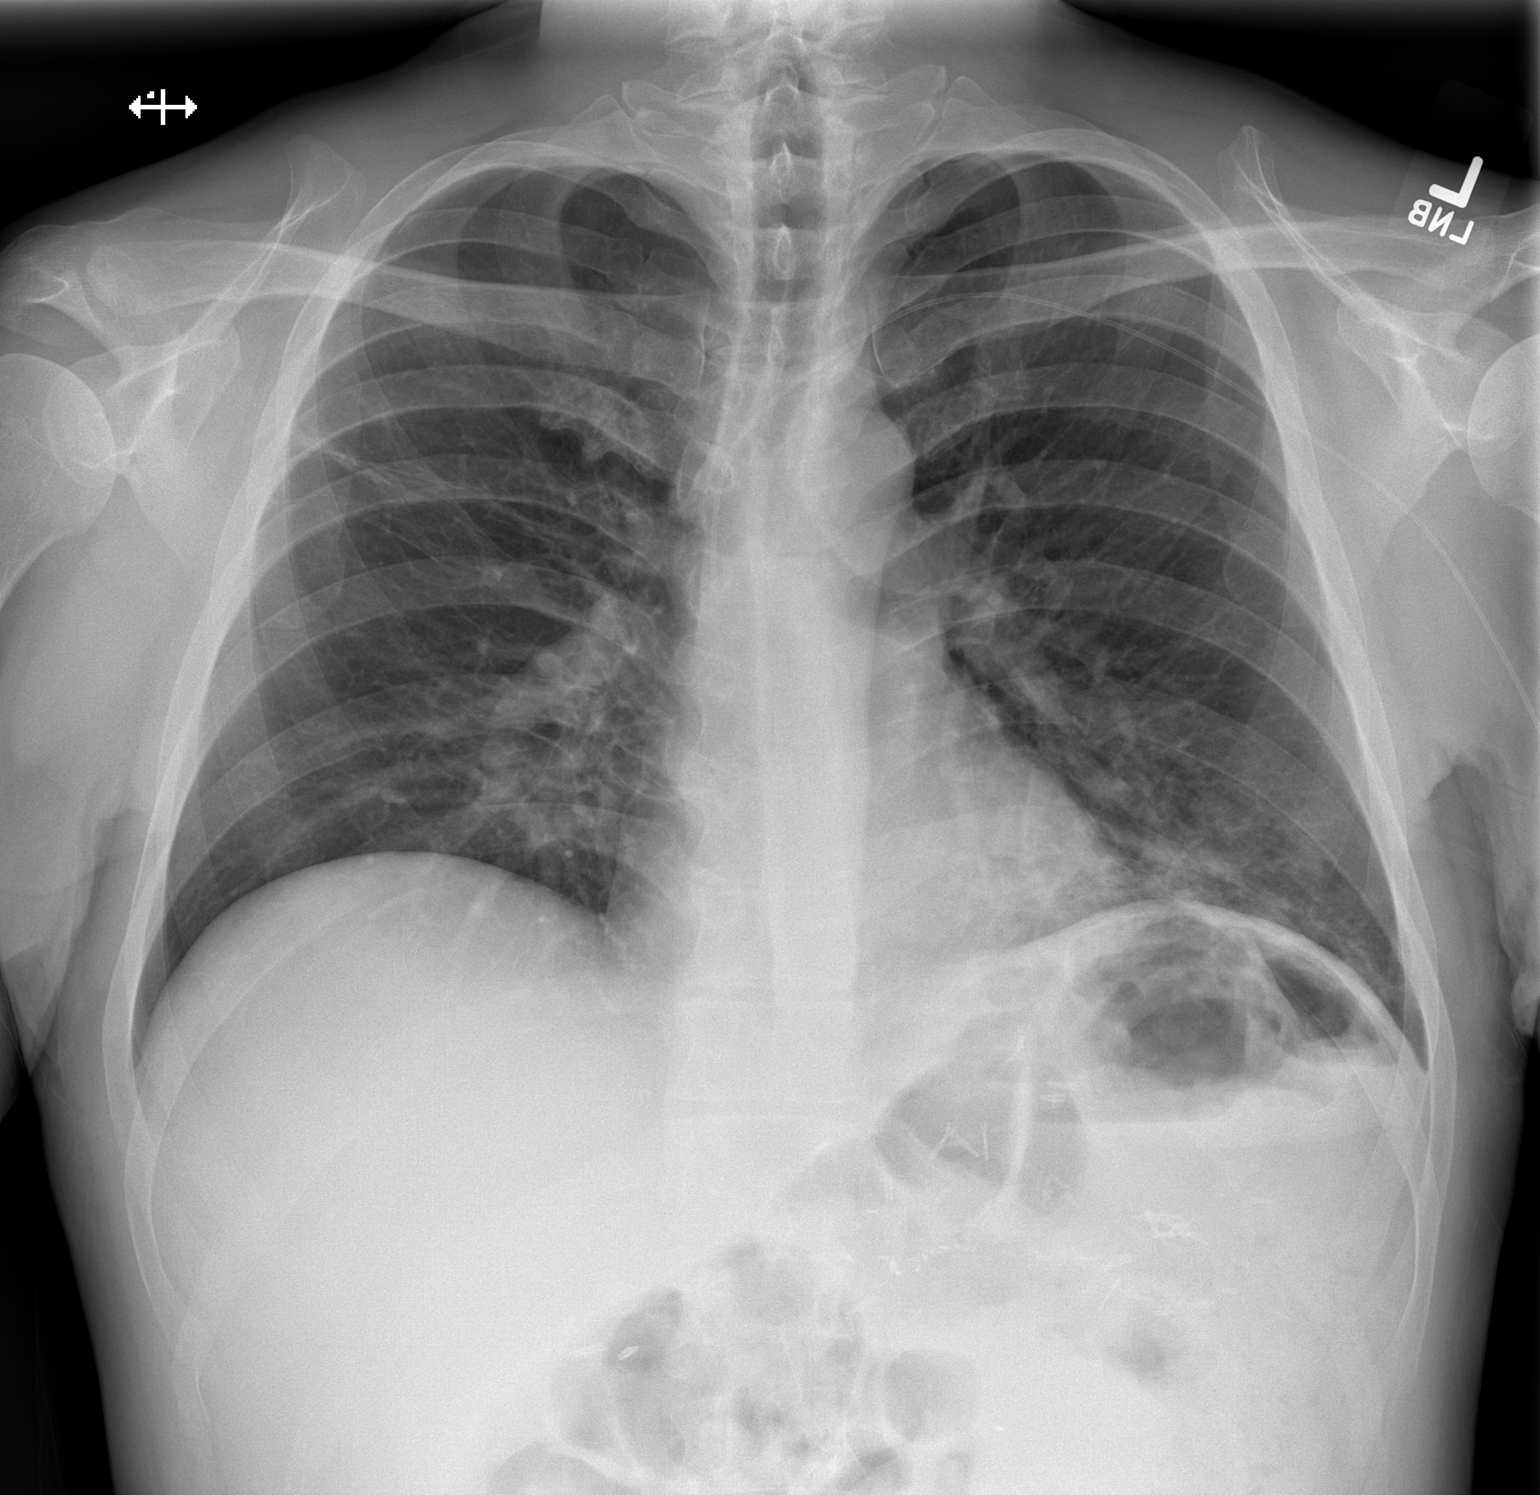
[im 2/2]
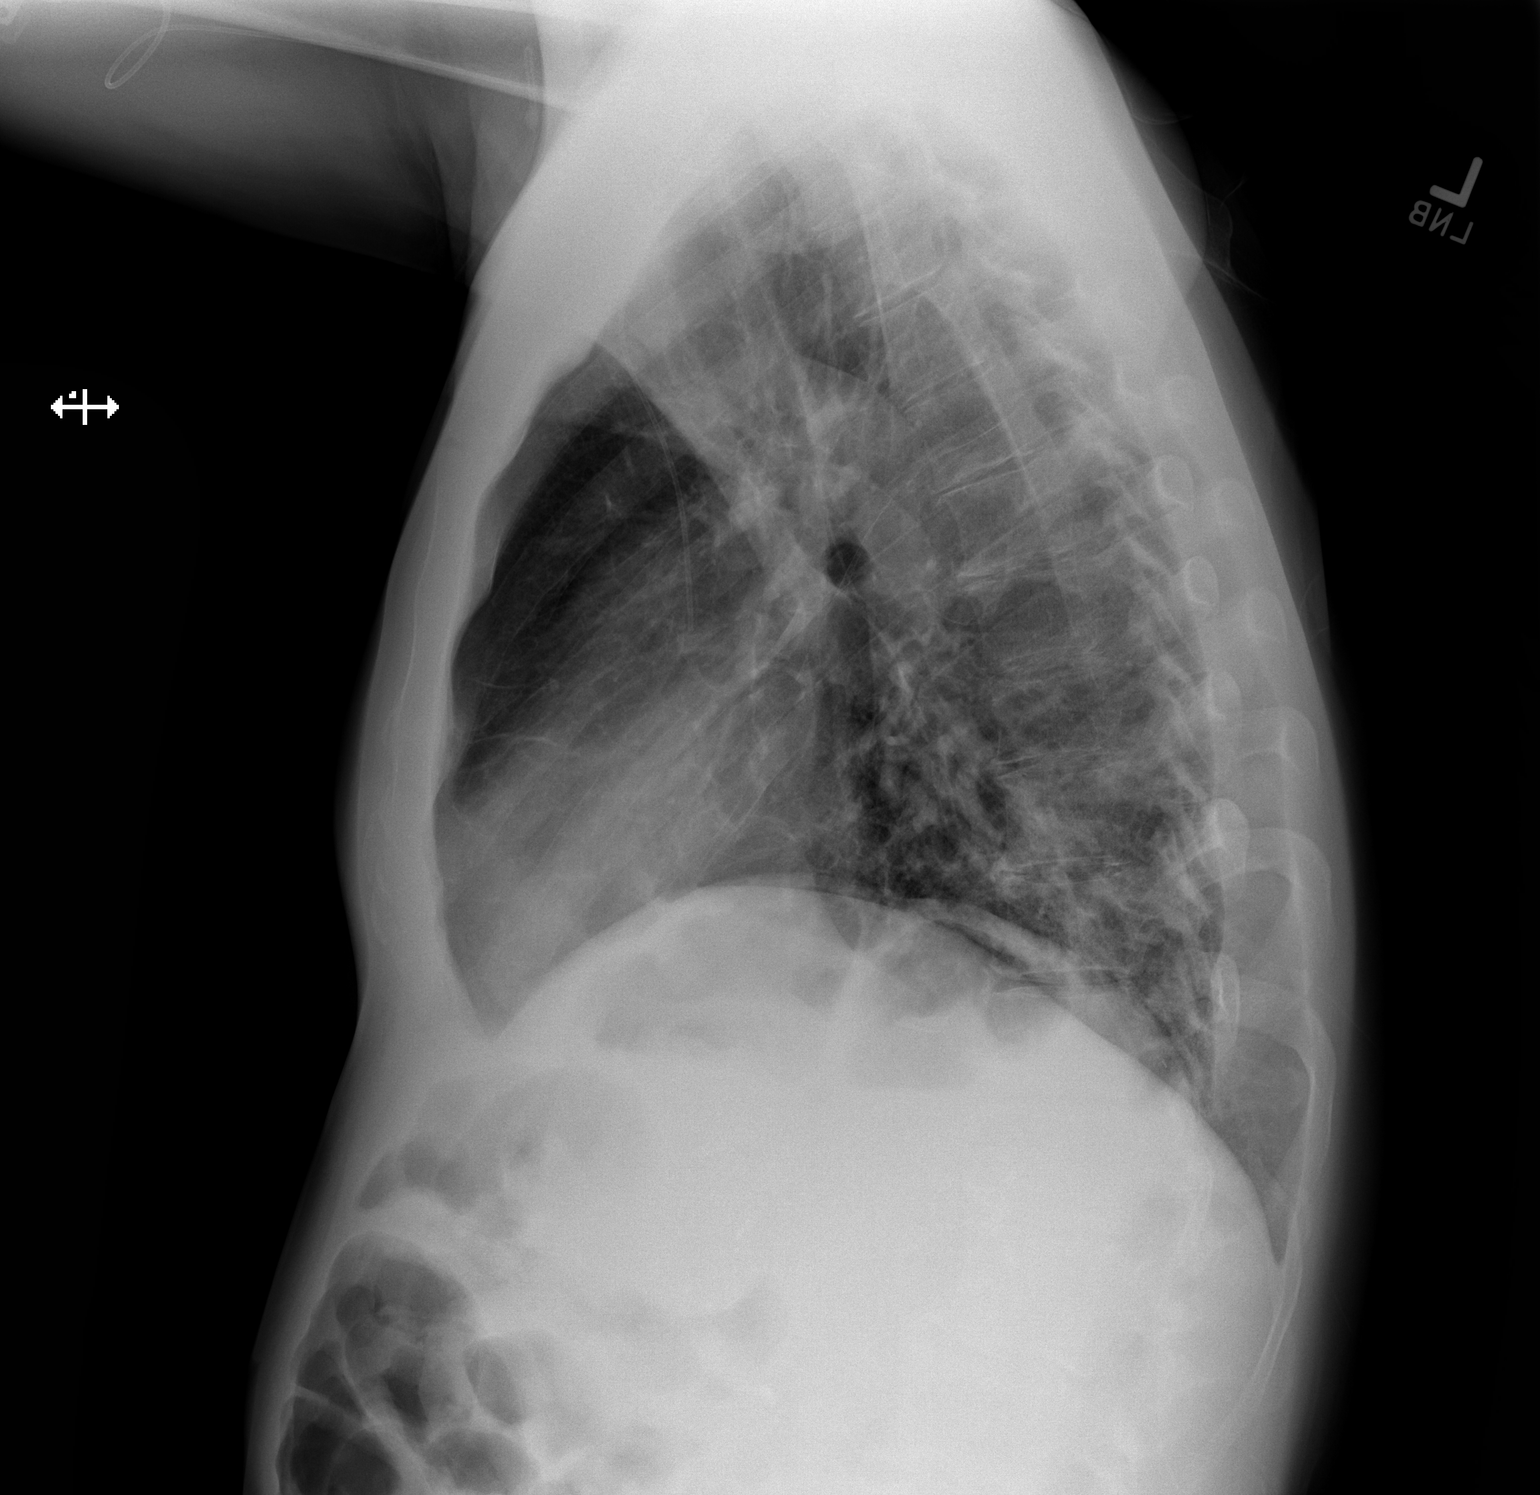

[2 of 2 positions shown; findings below may reference images not displayed]

PROCEDURE:     DXR - DXR CHEST PA (OR AP) AND LATERAL  - April 29, 2013  [DATE]

RESULT:     Comparison is made to the study April 24, 2013.

The lungs are adequately inflated. There are increased perihilar and
infrahilar lung markings consistent with interstitial and early alveolar
pneumonia. The cardiac silhouette is normal in size. The pulmonary
vascularity is not engorged. The mediastinum is normal in width. There is a
PICC line via the left upper extremity with tip lies in the region of the
proximal SVC.
IMPRESSION: The findings are consistent with bilateral pneumonia. The
findings are most conspicuous in the left lower lobe. Followup films
following therapy are recommended.

[REDACTED]

## 2014-06-09 IMAGING — CT CT CHEST W/O CM
1 series · 15 of 33 positions shown, 19 images · non-contrast
Comparison: none

REASON FOR EXAM: recurrent bil pna
COMMENTS:

[Series 2: soft tissue · axial · 0.75mm/px · z∈[-373,-73]mm · 15 of 118 slices shown, 19 images]
[im 9/118  mediastinal]
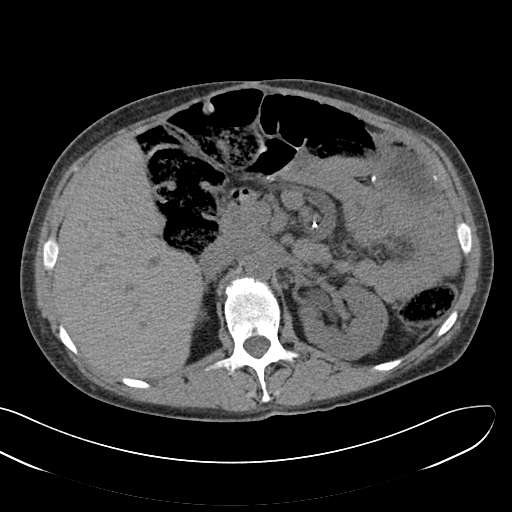
[im 9/118  lung]
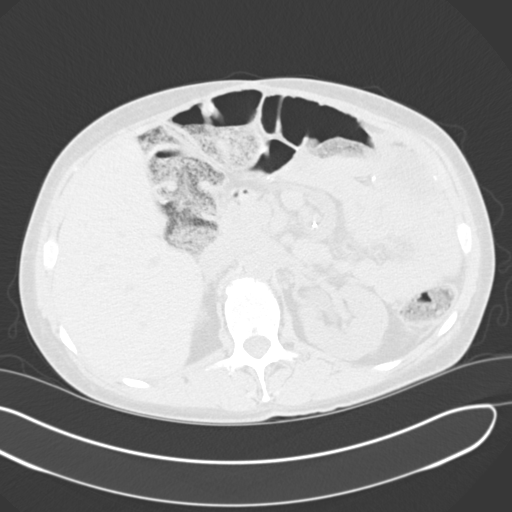
[im 18/118  lung]
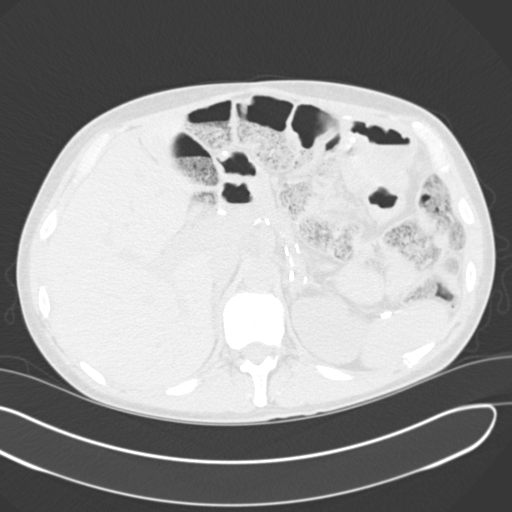
[im 24/118  lung]
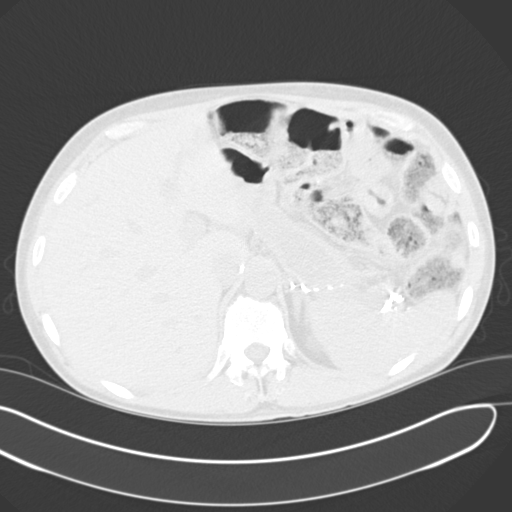
[im 31/118  lung]
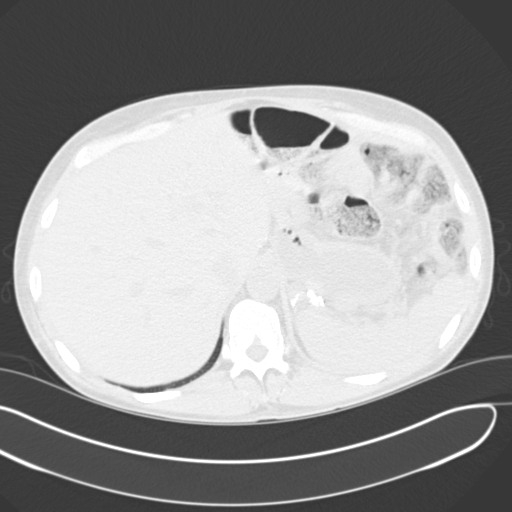
[im 40/118  mediastinal]
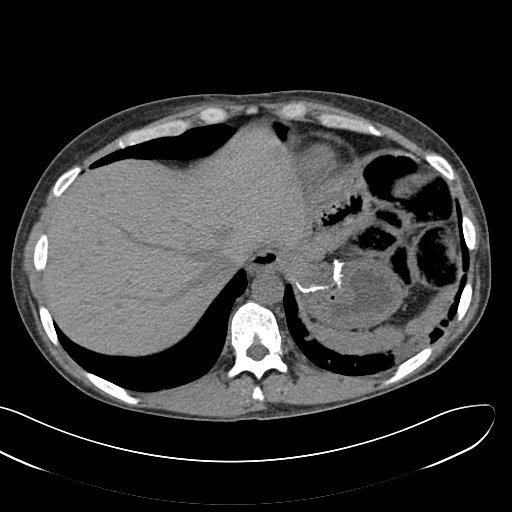
[im 40/118  lung]
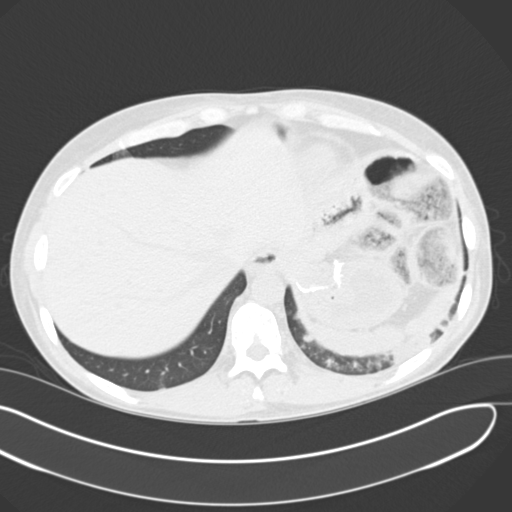
[im 47/118  lung]
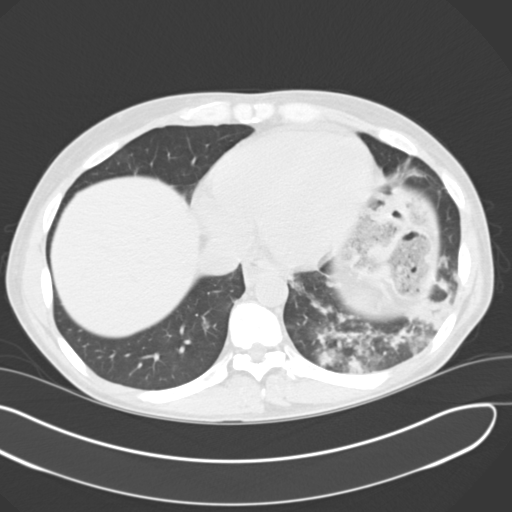
[im 53/118  lung]
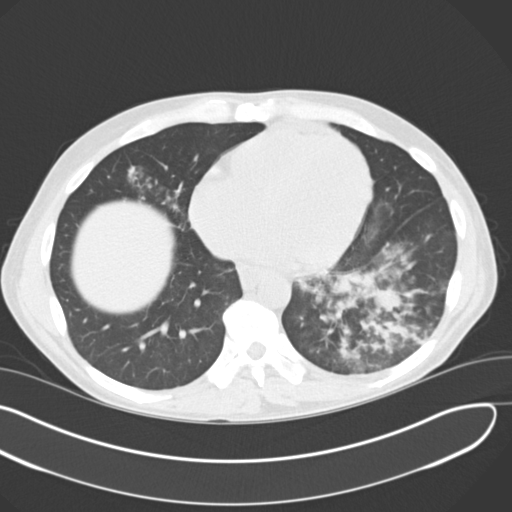
[im 61/118  lung]
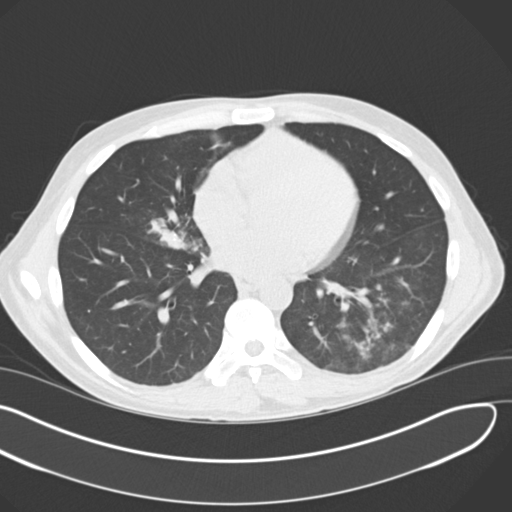
[im 66/118  mediastinal]
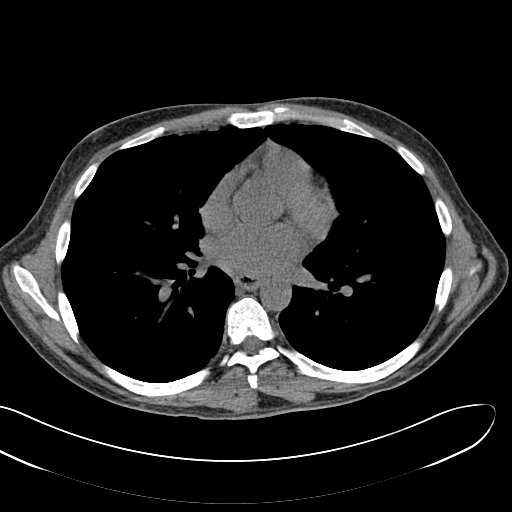
[im 66/118  lung]
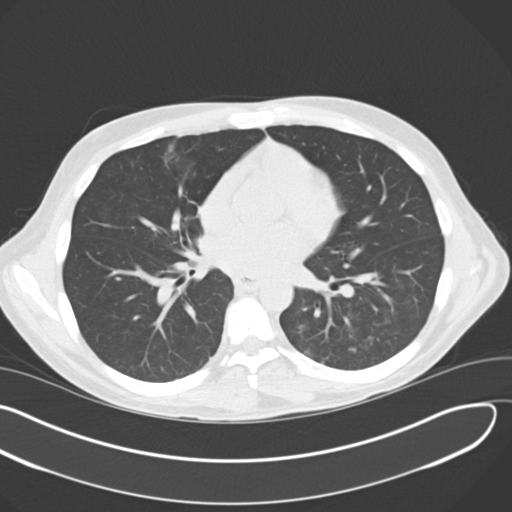
[im 71/118  lung]
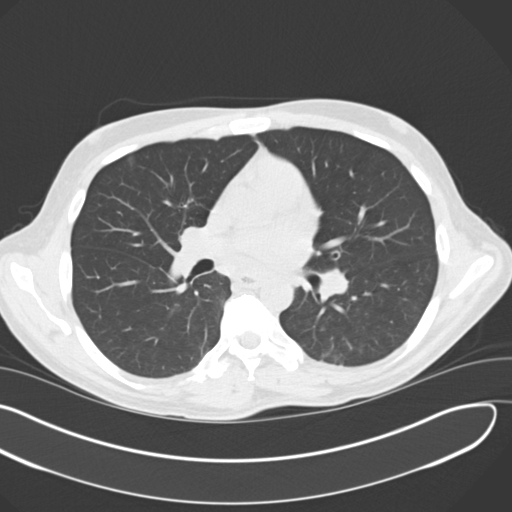
[im 79/118  lung]
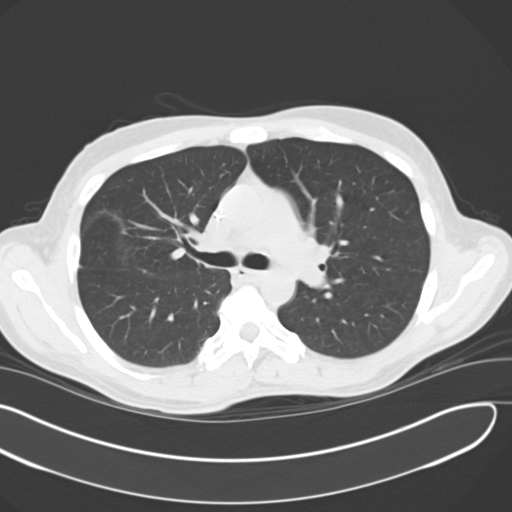
[im 87/118  lung]
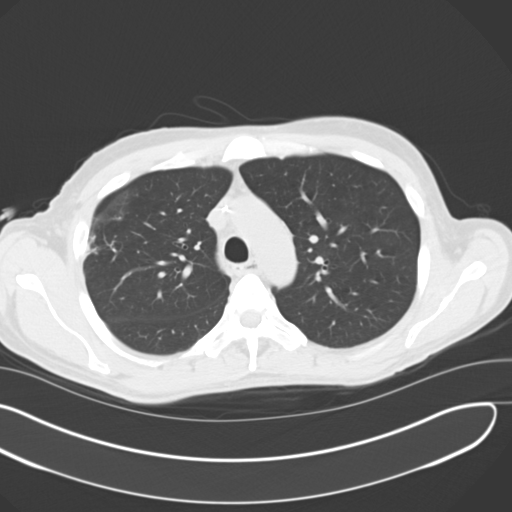
[im 94/118  mediastinal]
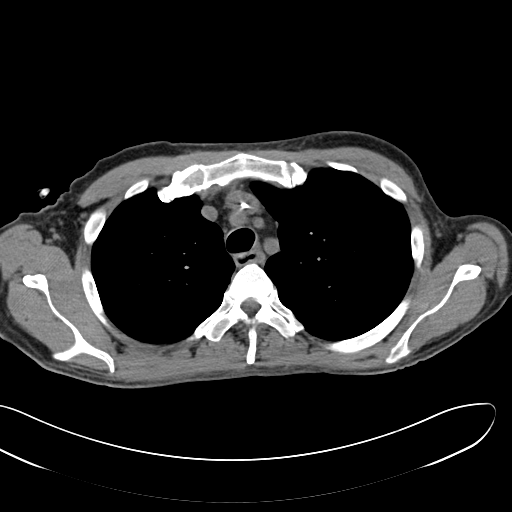
[im 94/118  lung]
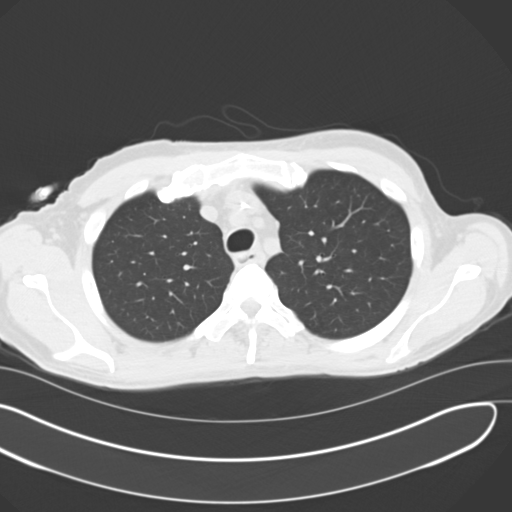
[im 100/118  lung]
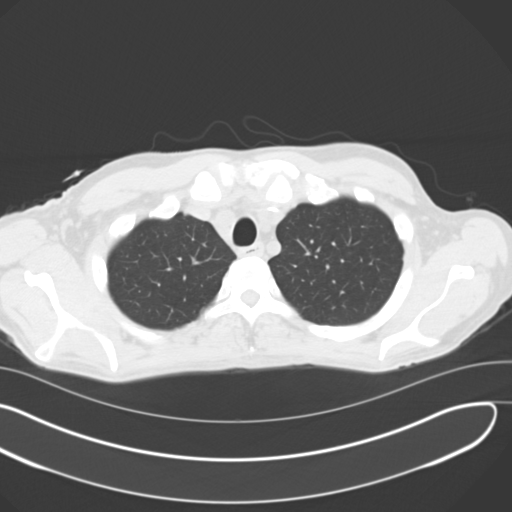
[im 109/118  lung]
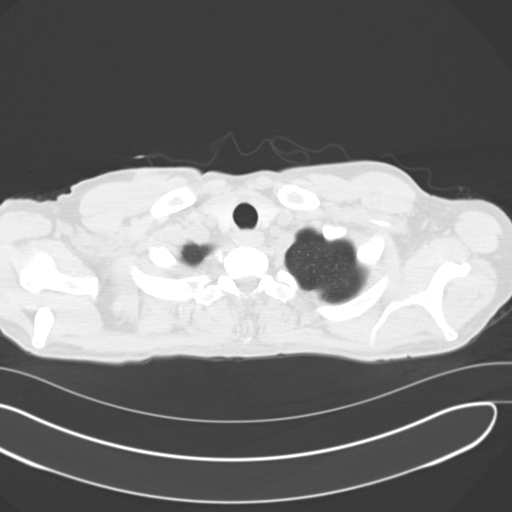

[15 of 33 positions shown; findings below may reference images not displayed]

PROCEDURE:     CT  - CT CHEST WITHOUT CONTRAST  - April 30, 2013  [DATE]

RESULT:     Axial noncontrast CT scanning was performed through the chest
with reconstructions at 3 mm intervals and slice thicknesses. Review of
multiplanar reconstructed images was performed separately on the VIA monitor.

At lung window settings there is patchy density peripherally in the right
upper lobe and in the right middle lobe. There are areas of confluent patchy
density in the left lower lobe posteriorly. The findings suggest
pneumonitis. There is no bronchiectasis. No classic alveolar infiltrates are
demonstrated. The cardiac chambers are normal in size. The caliber of the
thoracic aorta is normal. There is no pleural nor pericardial effusion. As
best as can be determined there is no mediastinal nor hilar lymphadenopathy.

Within the upper abdomen the observed portions of the liver exhibit no focal
masses. There are numerous surgical clips in the retroperitoneum. There is
an extrarenal pelvis on the left. The observed portions of the spleen and
adrenal glands appear normal.
IMPRESSION: 1. There are areas of patchy increased interstitial density with fluffy
margins involving the right upper lobe, the right middle lobe, and the left
lower lobe. The findings suggest pneumonitis. The findings may be infectious
or inflammatory. Neoplasm is not felt highly likely but is not absolutely
excluded.
2. There is no definite evidence of mediastinal nor hilar lymphadenopathy.
3. There is no evidence of CHF nor of an alveolar pneumonia.
4. There is no pleural effusion.

[REDACTED]

## 2014-11-19 NOTE — H&P (Signed)
PATIENT NAME:  Bobby [name], STITES MR#:  [id number] DATE OF BIRTH:  05-31-1971  DATE OF ADMISSION:  05/24/2013  PRIMARY CARE PHYSICIAN: Marland Mcalpine A. Tejan-Sie, MD  HISTORY OF PRESENT ILLNESS:  The patient is a 44 year old Caucasian male with a past medical history significant for history of multiple medical problems including history of diabetes mellitus, insulin-dependent; history of chronic diarrhea; chronic renal failure; Mycobacterium avium infection; chronic pain syndrome, who presents to the hospital with unresponsiveness or poor responsiveness. Apparently, the patient expressed a desire to not live any longer and to commit suicide today, to his mom. She found him unresponsive and found (Dictation Anomaly)an empty Ambien bottle next to him.  In the recent past, he had approximately 30 pills of Ambien still in the bottle. He was brought by EMS, somnolent and poorly responsive, responsive just to pain syndrome, tachycardic; however, his vital signs otherwise were unremarkable. The patient's mother initially refused the patient to be intubated and Emergency Room physician had a long discussion with [facility] lawyer as well as administration. IV fluids were administered and Poison Control Center was also contacted. Poison Control Center recommended to follow the patient's glucose levels very closely, as it appears that they was a spike of new overdoses where the patients would mask overdose with other medications. The patient's initial blood glucose level Accu-Chek was around 100; however, when was rechecked in the Emergency Room was found to be 25. The patient was given a D50 ampule and was started on a D10 water solution. His labs were taken and the labs also showed high level of Tylenol, of 80.  It is unclear when Tylenol was actually ingested, however, since the patient's Tylenol level was very high, acetylcysteine IV was initiated by Emergency Room physician. Other labs also showed abnormal thyroid  function tests, as well as liver function tests. Magnesium was low at 1.2 and potassium level was low at 3.0. Hospitalist services were contacted for admission.   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   ALLERGIES: MORPHINE, TYLENOL, NONSTEROIDAL ANTI-INFLAMMATORY MEDICATIONS.   SOCIAL HISTORY: No history of smoking, drinking alcohol or illegal drugs. Lives with his partner, boyfriend.   FAMILY HISTORY: Both parents have diabetes mellitus.   HOME MEDICATIONS: According to medical records, the patient was, at least most recently, on current medications: Ambien 10 mg p.o. at bedtime, amlodipine 10 mg p.o. daily; Avelox IV 400 mg every day; Caltrate with vitamin D 1 tablet twice daily; carvedilol 12.5 mg twice daily; Colace 100 mg twice daily as needed; Diovan 40 mg daily; fentanyl 100 mcg transdermal film once every 48 hours; gabapentin 400 mg 2 capsules which would be 800 mg 3 times daily; Humalog 6 units 3 times daily with meals and according to sliding scale; insulin glargine 7 units subcutaneously in the morning and 5 units at bedtime; MiraLax 17 g once daily as needed; oxycodone 10 mg every 4 hours as needed; promethazine 25 mg rectal suppository every 6 hours as needed; vitamin B12 at  1000 mcg p.o. daily; vitamin D3 at 50,000 units Mondays, Wednesdays, Fridays; Zenpep 25,000  units/136,000 units/85,000 units 3 capsules 3 times daily with meals and 1 to 2 capsules with snacks; Zithromax 250 mg p.o. daily; Zofran ODT 4 mg p.o. 3 times daily as needed.   REVIEW OF SYSTEMS: Not available as the patient is unresponsive, is responding only to pain stimuli.   PHYSICAL EXAMINATION VITAL SIGNS:  On arrival to the hospital, temperature 97.9, pulse 124, respiration rate was 16, blood pressure 128/76, saturation was 93% on room air. Current vital signs: Temperature is not measured, pulse is 76, respiratory rate is 25, blood pressure 144/78, saturation 100% on room air.   GENERAL:  This is a well-developed, nourished Caucasian male, pale, lying on the stretcher.  HEENT: His pupils are equal, 3 mm, reactive to light. Extraocular movements intact. No icterus or conjunctivitis. He has normal hearing. No pharyngeal erythema. Mucosa is very dry. The patient has open-mouth breathing. NECK:  No masses. Supple, nontender. Thyroid not enlarged.  No adenopathy. No JVD or carotid bruits bilaterally.  Full range of motion.  LUNGS: Clear to auscultation in all fields. No rales, rhonchi, diminished breath sounds or wheezing. No labored respirations, increased effort, dullness to percussion, overt respiratory distress.  CARDIOVASCULAR: S1, S2 appreciated. No murmurs, gallops, rubs. Rhythm was regular.  PMI not lateralized.  CHEST: Nontender to palpation.  EXTREMITIES: 1+ pedal pulses. No lower extremity edema, clubbing or cyanosis.  ABDOMEN:  Soft. No (Dictation Anomaly) tenderness. Minimal discomfort was felt whenever I palpated his abdomen; however, no masses, no hepatosplenomegaly were noted. Multiple scars were noted, which are well healed. Bowel sounds are present, but diminished.  MUSCLE STRENGTH: Not able to move; however, whenever he is stimulated with pain, sternal rub, he is able to localize and moves his upper extremities while trying to push the examiner. No cyanosis or degenerative joint  disease. Not able to assess for kyphosis. Gait is not tested.  SKIN: Did not reveal any rashes, lesions, erythema, nodularity or induration. It was warm and dry to palpation.   LYMPHATIC: No adenopathy in the cervical region.  NEUROLOGICAL: Cranial nerves grossly intact. Sensory not able to assess. He is nonverbal, not able to assess him for dysarthria. The patient is comatose, responding only to pain stimuli on sternal rub. He is able to localize with that and he moans whenever he is stimulated, able to move her upper extremities to push discomfort away.   LABORATORY AND RADIOLOGIC DATA:  The patient's EKG is pending. The patient's lab data done today, on the 26th of October 2014: Glucose 104, BUN and creatinine were 20 and 2.23, potassium 3.0, bicarbonate 18, magnesium level of 1.2 and lipase 26. Alcohol level less than 3.  Beta hydroxybutyrate was 0.96.  Liver enzymes: Alkaline phosphatase 137, AST 58. Troponin less than 0.02. TSH elevated at 5.63. Urine drug screen was negative. White blood cell count was normal at 7.6, hemoglobin 10.6, platelet count 273. Coagulation panel was normal limits. Urinalysis: Straw, clear urine; more than 500 glucose; negative for bilirubin or ketones; specific gravity 1.005; pH was 5.0; negative for blood, protein, nitrites or leukocyte esterase; less than 1 red blood cell as well as white blood cell; no bacteria; less than 1 epithelial cell, as well as mucus was present. Acetaminophen level was 80 and salicylate level was less than 1.7. The patient's ABGs just came in and the patient's pH was 7.34, pCO2 was 30, pO2 of 115, saturation was 93.7% on room  air with bicarbonate level of 16.2 and lactic acid level of 2.0.   ASSESSMENT AND PLAN: 1.  Overdose with multiple medications, as far as we know, likely Tylenol, Ambien as well as insulin.  Admit the patient to the medical unit, to critical care unit, start him on D5/water, checking his blood glucose frequently, also  acetylcysteine IV. We will continue supportive therapy. Will intubate as needed. The patient's mother is threatening a lawsuit and submitted the patient's will not to be intubated; however, she was told that his condition is reversible and needs to be treated. We will continue to follow up. 2.  Hypoglycemia as above, D5 water. We will continue Accu-Cheks, but no insulin therapy at this time.  3.  Tylenol overdose. Acetylcysteine, follow liver enzymes as well as pro time in the morning.  4.  Metabolic encephalopathy. We will continue neuro checks, intubate if the patient is worsening.  5.  Hypokalemia, hypomagnesemia; supplementing IV.  6.  Chronic pain syndrome. Will hold all opiates. Will take fentanyl patch if it is still on.  7.  Acidosis, lactic acidosis, likely due to poor perfusion. We will continue on IV fluids for now and we will add also bicarbonate drip. We will check the patient's bicarbonate level later today.   TIME SPENT:  One hour and 50 minutes.    ____________________________ Katharina Caperima Vladimir Lenhoff, MD rv:cs D: 05/24/2013 17:19:00 ET T: 05/24/2013 19:57:19 ET JOB#: [id number]  cc: Sheikh A. Ellsworth Lennoxejan-Sie, MD Katharina Caperima Tea Collums, MD, <Dictator>    Malay Fantroy Winona LegatoVAICKUTE MD ELECTRONICALLY SIGNED 06/27/2013 20:33

## 2014-11-19 NOTE — Consult Note (Signed)
Note Type Consult   HPI: Referred by Bobby [name], M.D.   This 44 year old Male patient presents to the clinic for initial evaluation of  chronic dehydration requiring IV fluids.  Subjective: Chief Complaint/Diagnosis:   Chronic dehydration requiring IV fluids. HPI:   Patient is a 44 year old male with multiple medical problems including total gastrectomy and pancreatectomy.  Patient has difficulty absorbing fluids and typically requires emergency room visits approximately once per week for dehydration.  Currently, he feels at his baseline.  Much of his history is given by his partner.  He is chronically weak and fatigued.  He has periodic nausea and vomiting that can be severe at times.  He has no neurologic complaints.  He denies any chest pain or shortness of breath.  He has no urinary complaints.  Patient offers no further specific complaints today.   Review of Systems:  Performance Status (ECOG): 2  Review of Systems:   As per HPI. Otherwise, 10 point system review was negative.   Allergies:  Coricidin: Swelling  Morphine: Hives, Itching  Tylenol: Other  Non-Steroidal Anti-Inflammatory Drugs: Do NOT Use  PFSH: Additional Past Medical and Surgical History: nesidobalastosis, chronic pain, dehydration, anxiety, diabetes, peripheral neuropathy, GERD, seizures, migraines, hypertension, total gastrectomy, pancreatectomy, cholecystectomy.    Family history: Cervical, lung, and skin cancer.  CAD.    Social history: Patient denies tobacco or alcohol.   Home Medications: Medication Instructions Last Modified Date/Time  Ambien 10 mg oral tablet 1 tab(s) orally once a day (at bedtime) 05-Sep-14 15:58  ondansetron 4 mg tablet, disintegrating 1 tab(s) orally every 6 hours- as needed  05-Sep-14 15:58  Ambien 10 mg oral tablet 1 tab(s) orally once a day (at bedtime) 05-Sep-14 15:58  Zenpep 25,000 units-136,000 units-85,000 units oral delayed release capsule 3 cap(s) orally 3 times a  day (with meals) and 1-2 capsules with snacks 05-Sep-14 15:58  oxycodone 10 mg oral tablet 1 tab(s) orally every 4 hours, As Needed - for Pain 05-Sep-14 15:58  amLODIPine 10 mg oral tablet 1 tab(s) orally once a day (in the morning) 05-Sep-14 15:58  fentaNYL 100 mcg/hr transdermal film, extended release 1 patch transdermal every 48 hours 05-Sep-14 15:58  gabapentin 400 mg oral capsule 2 caps orally 3 times a day 05-Sep-14 15:58  multivitamin 1 tab(s) orally once a day 05-Sep-14 15:58  Calcitrate with D 1 tab (630 milligrams) orally 2 times a day 05-Sep-14 15:58  Vitamin D3 50,000 intl units oral capsule 1 cap(s) orally Monday, Wednesday, and Friday 05-Sep-14 15:58  Carafate 1 g oral tablet 2 tabs orally 3 times a day 05-Sep-14 15:58  Zofran 8 mg oral tablet 1 tab(s) orally every 8 hours, As Needed - for Nausea, Vomiting 05-Sep-14 15:58  Zofran ODT 4 mg oral tablet, disintegrating 1 tab(s) orally every 4 to 6 hours, As Needed - for Nausea, Vomiting 05-Sep-14 15:58  promethazine 25 mg rectal suppository 1 suppository(ies) rectal every 6 hours, As Needed - for Nausea, Vomiting 05-Sep-14 15:58  Vitamin B12 1000 mcg oral tablet 1 tab(s) orally once a day 05-Sep-14 15:58  Colace sodium 100 mg oral capsule 1 cap(s) orally 2 times a day, As Needed - for Constipation 05-Sep-14 15:58  MiraLax - oral powder for reconstitution 17 gram(s) in 8oz of fluid and drink orally once a day as needed for constipation 05-Sep-14 15:58  HumaLOG 100 units/mL subcutaneous solution 6 unit(s) subcutaneous 3 times a day (with meals) and per sliding scale 05-Sep-14 15:58  Diovan 80 mg oral tablet  1 tab(s) orally once a day (in the morning) 05-Sep-14 15:58  insulin glargine 100 units/mL subcutaneous solution 10 unit(s) subcutaneous once a day (in the morning) 05-Sep-14 15:58  insulin glargine 100 units/mL subcutaneous solution 4 unit(s) subcutaneous once a day (at bedtime) 05-Sep-14 15:58  carvedilol 12.5 mg oral tablet 1 tab(s)  orally 2 times a day 05-Sep-14 15:58  Zithromax 250 milligram(s) intravenous once a day 05-Sep-14 15:58  Avelox I.V. 400 mg/250 mL intravenous solution  intravenous once a day 05-Sep-14 15:58   Vital Signs:  :: Temp: 98.3 Pulse: 65 RR: 20  BP: 164/89   Physical Exam:  General: ill-appearing, thin, no acute distress  Mental Status: flat affect  Eyes: anicteric sclera  Head, Ears, Nose,Throat: Normocephalic, moist mucous membranes, clear oropharynx without erythema or thrush.  Neck, Thyroid: No palpable lymphadenopathy, thyroid midline without nodules.  Respiratory: clear to auscultation bilaterally  Cardiovascular: regular rate and rhythm, no murmur, rub, or gallop  Gastrointestinal: soft, hypoactive bowel sounds  Musculoskeletal: No edema  Skin: No rash or petechiae noted  Neurological: alert, answering all questions appropriately.  Cranial nerves grossly intact   Assessment and Plan: Impression:   Chronic dehydration requiring IV fluids. Plan:   1.  Chronic dehydration: Patient appears to require IV fluids approximately once per week.  To avoid repeated emergency room visits, [facility]  He reports that he is currently getting home health for IV antibiotics for the next 8 weeks.  We have sent in order to the home health company to give patient his IV fluids along with his antibiotics.  Once his home health ends, he will return to clinic in 8 weeks for further evaluation and initiation of his weekly IV fluids.  Both patient and his partner expressed understanding and were in agreement with this plan.  CC Referral:  cc: Dr. Cherylann Ratel   Electronic Signatures: Gerarda Fraction (MD)  (Signed 09-Sep-14 15:20)  Authored: Note Type, History of Present Illness, CC/HPI, Review of Systems, ALLERGIES, Patient Family Social History, HOME MEDICATIONS, Vital Signs, Physical Exam, Assessment and Plan, CC Referring Physician   Last Updated: 09-Sep-14 15:20 by  Gerarda Fraction (MD)

## 2014-11-19 NOTE — Discharge Summary (Signed)
PATIENT NAME:  Bobby [name], [name] MR#:  [id number] DATE OF BIRTH:  Jan 05, 1971  DATE OF ADMISSION:  04/10/2013 DATE OF DISCHARGE:  04/15/2013  ADMITTING PHYSICIAN: Katharina Caperima Vaickute, MD  DISCHARGING PHYSICIAN: Silas FloodSheikh A. Ellsworth Lennoxejan-Sie, MD  PROCEDURES: None.   CONSULTATIONS:  1.  Endocrinology, Dr. Tedd SiasSolum.  2.  Infectious disease, Dr. Luan PullingFitzpatrick.   DISCHARGE DIAGNOSES: 1.  Antibiotic-induced diarrhea.  2.  History of Mycobacterium avium pulmonary infection.  3.  Chronic kidney disease, stage III, without acute tubular necrosis.  4.  Dehydration.  5.  Type 1 diabetes, labile and uncontrolled.   IMAGING: Chest x-ray negative for cardiopulmonary disease.   PROCEDURES: None.   HOSPITAL COURSE: This gentleman was admitted to the Emergency Room on 04/10/2013 where he presented with severe diarrhea 7 days after initiation of treatment for MAI infection. He also demonstrated marked dehydration with acute on chronic renal failure and hyperglycemia without ketoacidosis. His potassium was elevated initially but was treated. Please see history and physical for details.   The patient was admitted to medical floor, placed on intravenous normal saline, with gradual improvement in his renal function. He was nonoliguric throughout his stay. His diarrhea was attributed to antibiotic-induced diarrhea from his Avelox and Zithromax. His C. difficile toxin analysis was negative. The patient had his usual labile blood sugar readings, for which I consulted Dr. Tedd SiasSolum, who adjusted that and was able to get some modicum of stability prior to his discharge. The patient has chronic pain, for which his medications were resumed without any complications. His white cell count remained fairly stable throughout his stay.   The patient was discharged to home in a satisfactory condition. He will resume home health for his intravenous antibiotics for MAI. We did consult Dr. Luan PullingFitzpatrick regarding his MAI. However, the patient disagreed with  Dr. Lajuana RippleFitzpatrick'Evart Mcdonnell decision to discontinue his antibiotics now and wanted to complete his course as initially prescribed for 8 weeks.   DISPOSITION: The patient was discharged home in satisfactory condition.   DISCHARGE MEDICATIONS: Please refer to discharge medical reconciliation for his medications, but they do include Avelox 400 mg once a day,  Zithromax 250 mg once a day,  Humalog 6 units 3 times a day, Lantus 7 units q.a.m. and at bedtime, Zenpep 25,000/136,000 units 3 caps t.i.d. with meals and 1 to 2 capsules with snacks.   DISCHARGE INSTRUCTIONS: DIET: Low-sodium, low-fat, low-cholesterol, ADA diet.   ACTIVITY LIMITATIONS: None.  FOLLOWUP: With Dr. Ellsworth Lennoxejan-Sie in 1 to 2 weeks. The patient is also to keep his appointment with [facility] and with endocrinology.    ____________________________ Silas FloodSheikh A. Ellsworth Lennoxejan-Sie, MD sat:jm D: 05/02/2013 15:48:44 ET T: 05/02/2013 16:47:42 ET JOB#: [id number]  cc: Marland McalpineSheikh A. Ellsworth Lennoxejan-Sie, MD, <Dictator> Charlesetta GaribaldiSHEIKH A TEJAN-SIE MD ELECTRONICALLY SIGNED 05/06/2013 14:05

## 2014-11-19 NOTE — H&P (Signed)
PATIENT NAME:  Bobby [name], Bobby [name] MR#:  [id number] DATE OF BIRTH:  November 19, 1970  DATE OF ADMISSION:  02/27/2013  PRIMARY CARE PHYSICIAN: Dr. Ellsworth Lennoxejan-Sie.    CHIEF COMPLAINT: Nausea, vomiting.   HISTORY OF PRESENT ILLNESS: This is a 44 year old man with multiple hospitalizations and medical problems. He presents to the ER today after waking up this morning at 1 a.m., felt very nauseated and tired. Then at 5 a.m., really nauseous and vomited and intermittently vomiting during the day. He called Dr. Eustaquio Boydenejan-Sie's office. Did not get a call back and called Dr. Garnett FarmLateef's nephrology office and was told to go to the ER. He does have some epigastric pain, but he does have some chronic abdominal pain and he is having bowel movements. No blood in the vomit. No blood in the bowel movements. In the ER, he was found to be in acute renal failure with a creatinine of 3.08, and hospitalist services were contacted for further evaluation   PAST MEDICAL HISTORY: Diabetes (brittle), diabetic peripheral neuropathy, diabetic gastroparesis, hypertension, chronic abdominal pain, peptic ulcer disease, migraines.   PAST SURGICAL HISTORY: Pancreatectomy x2 surgeries, gastric bypass surgery, surgery for a volvulus, cholecystectomy and a G-tube placement.   ALLERGIES: MORPHINE, TYLENOL, NSAIDS AND CORICIDIN.   MEDICATIONS: As per the patient's list include Lantus 10 units in the a.m. and 4 units in the p.m., lispro 6 units with meals and sliding scale on top of that, Diovan 80 mg in the a.m., Norvasc 10 mg in the a.m., carvedilol 12.5 mg b.i.d., hydralazine 50 mg b.i.d., fentanyl patch 100 mcg q.48 hours, oxycodone 10 mg q.4 hours p.r.n., gabapentin 800 mg t.i.d., Ambien 10 mg at bedtime, multivitamin daily, thiamine 100 mg daily, calcium citrate plus D 630 mg b.i.d., vitamin D3 50,000 units Monday, Wednesday, Friday, Zenpep (which is pancrelipase) 25,000 units 3 capsules with meals and 1 to 2 tabs with snacks, Carafate liquid 2 g t.i.d.,  Zofran 8 mg q.8 hours p.r.n. and then another 4 mg orally disintegrating tablet q.4 to 6 hours p.r.n., Phenergan suppositories 25 mg q.6, vitamin B12 1000 mcg daily, Colace 100 mg p.r.n., MiraLAX p.r.n.   SOCIAL HISTORY: No smoking. No alcohol. No drug use. Lives with his boyfriend. He is disabled.   FAMILY HISTORY: Both parents with diabetes, but mother's had resolved after a gastric bypass.   REVIEW OF SYSTEMS:  CONSTITUTIONAL: Very low-grade fever and some chills, some sweats. He does have some fatigue. No weight gain. No weight loss.  EYES: He does wear glasses.  EARS, NOSE, MOUTH AND THROAT: Positive for hoarse voice. No sore throat.  CARDIOVASCULAR: No chest pain. Positive for palpitation.  RESPIRATORY: No shortness of breath. No cough. No sputum. No hemoptysis.  GASTROINTESTINAL: Positive for nausea. Positive for vomiting. Positive for abdominal pain. No diarrhea. No bright red blood per rectum. No melena.  GENITOURINARY: No burning on urination or hematuria.  MUSCULOSKELETAL: No joint pain or muscle pain.  INTEGUMENT: No rashes or eruptions.  NEUROLOGIC: No fainting or blackouts.  PSYCHIATRIC: No anxiety or depression.  ENDOCRINE: No thyroid problems.  HEMATOLOGIC AND LYMPHATIC: History of anemia.   PHYSICAL EXAMINATION:  VITAL SIGNS: Temperature 99.1, pulse 94, respirations 20, blood pressure 150/80, pulse ox 98% on room air.  GENERAL: No respiratory distress.  EYES: Conjunctivae and lids normal. Pupils equal, round and reactive to light. Extraocular muscles intact. No nystagmus.  EARS, NOSE, MOUTH AND THROAT: Tympanic membranes: No erythema. Nasal mucosa: No erythema. Throat: No erythema, no exudate seen. Lips and gums: No  lesions.  NECK: No JVD. No bruits. No lymphadenopathy. No thyromegaly. No thyroid nodules palpated.  RESPIRATORY: Lungs clear to auscultation. No use of accessory muscles to breathe. No rhonchi, rales or wheeze heard.  CARDIOVASCULAR SYSTEM: S1, S2 normal. No  gallops, rubs or murmurs heard. Carotid upstroke 2+ bilaterally. No bruits.  EXTREMITIES: Dorsalis pedis pulses 2+ bilaterally. No edema of the lower extremities.  ABDOMEN: Soft, tenderness in the epigastric area. No organo- or splenomegaly. Normoactive bowel sounds. No masses felt.  LYMPHATIC: No lymph nodes in the neck.  MUSCULOSKELETAL: No clubbing, edema or cyanosis.  SKIN: No rashes or ulcers seen.  NEUROLOGIC: Cranial nerves II through XII grossly intact. Deep tendon reflexes 2+ bilateral lower extremities.  PSYCHIATRIC: The patient is oriented to person, place and time   LABORATORY AND RADIOLOGICAL DATA: White blood cell count 8.6, H and H 9.9 and 29.7, platelet count of 301. Glucose 159, BUN 33, creatinine 3.08, sodium 135, potassium 4.5, chloride 107, CO2 19, calcium 8.5. LIVER FUNCTION TESTS: Alkaline phosphatase 160. Other liver function tests normal range. Lipase 38.   EKG: Normal sinus rhythm, no acute ST-T wave changes.   ASSESSMENT AND PLAN:  1. Acute on chronic renal failure: Will give intravenous fluid hydration. Hold Diovan. The patient did have a recent ultrasound of the kidneys, so I will not repeat that. Continue to monitor creatinine on a daily basis.  2. Nausea and vomiting with history of gastroparesis: The patient thinks that he is over his vomiting and would like a regular diet which I will give. Continue the patient's usual medications. He does take Carafate.  3. Diabetes: Very brittle. Was recently in for diabetic ketoacidosis. Continue his usual regimen. May need to watch this closely because insulin sometimes sticks around longer with renal disease.  4. Hypertension: Will hold Diovan and continue the other medications and watch closely.  5. Chronic abdominal pain: Continue fentanyl patch and oxycodone.  6. Peripheral neuropathy: Continue gabapentin.  7. History of pancreatic removal: Continue Creon while here with meals and with snacks.   TIME SPENT ON ADMISSION:  55 minutes.   ____________________________ Herschell Dimes. Renae Gloss, MD rjw:gb D: 02/27/2013 18:07:53 ET T: 02/27/2013 18:18:08 ET JOB#: [id number]  cc: Herschell Dimes. Renae Gloss, MD, <Dictator> Sheikh A. Ellsworth Lennox, MD Salley Scarlet MD ELECTRONICALLY SIGNED 03/13/2013 16:33

## 2014-11-19 NOTE — H&P (Signed)
PATIENT NAME:  Bobby [name], Bobby [name] MR#:  [id number] DATE OF BIRTH:  March 20, 1971  DATE OF ADMISSION:  12/12/2012  PRIMARY CARE PHYSICIAN: Marland Mcalpine A. Ellsworth Lennox, MD  REFERRING EMERGENCY ROOM PHYSICIAN: Caleen Jobs. Braud, MD    CHIEF COMPLAINT: Hyperglycemia.   HISTORY OF PRESENT ILLNESS: A 44 year old male with history of gastric bypass surgery and multiple complications leading to total pancreatectomy, vagotomy and multiple abdominal surgeries due to volvulus and repairs and partial resection of intestine. He was recently admitted to hospital 10 days ago for hyperglycemia and at that time, Dr. Ellsworth Lennox  increased his Lantus from 12 to 15 units daily. For the last 2 to 3 days, he is having complaint of fever and chills and also having abdominal pain and vomiting. He is having chronic diarrhea due to his pancreatectomy, but there is no change in his bowel habits recently. He noticed his sugar has been remaining high for 2 days, last time yesterday was more than 400, and then today he noticed it is remaining high. He took some extra insulin than what he was taking regularly, but still his sugar is remaining high. He checked multiple times and so finally decided to come to the Emergency Room. In ER on initial check, his sugar was more than 600 and so he is being admitted for further management of his hyperglycemia. Other complaints: He also complained of chronic abdominal pain, which he has due to multiple surgeries, but there is worsening of his pain for the last 2 days which is all over.  REVIEW OF SYSTEMS:   CONSTITUTIONAL: Positive for fever and fatigue. No weight loss or weight gain.  EYES: No blurring or double vision, pain or redness.  EARS, NOSE, THROAT: No tinnitus, ear pain or hearing loss.  RESPIRATORY: No cough, wheezing, hemoptysis or shortness of breath.  CARDIOVASCULAR: No chest pain, orthopnea, edema or arrhythmia.  GASTROINTESTINAL: He has nausea, vomiting, chronic diarrhea and abdominal pain.   GENITOURINARY: No change in urinary habits. No burning in the urination.  ENDOCRINE: No heat or cold intolerance.  SKIN: No acne or rashes or lesions.  MUSCULOSKELETAL: No pain or swelling in the joints.  NEUROLOGICAL: No numbness, weakness, tremors or vertigo.  PSYCHIATRIC: No anxiety, insomnia or bipolar disorder.   PHYSICAL EXAMINATION: VITAL SIGNS: In ER, temperature 98, pulse rate 92, respirations 24, blood pressure 151/83, pulse oximetry 97 on room air.  GENERAL: He is fully alert and oriented to time, place and person. Mild distress due to pain.  HEENT: Head and neck atraumatic. Conjunctivae pink. Oral mucosa moist.  NECK: Supple. No JVD.  RESPIRATORY: Bilateral clear and equal air entry. On the right side upper chest, Mediport present.  CARDIOVASCULAR: S1, S2 present, regular. No murmur.  ABDOMEN: Soft. Mildly tender. Scar of surgery present. Bowel sounds present.  SKIN: No rashes.  JOINTS: No swelling or tenderness.  NEUROLOGICAL: Power 5/5 all 4 limbs. No tremors.   LABORATORY DATA: Glucose 652, BUN 16, creatinine 1.63, sodium 129, potassium 3.5, chloride 96, magnesium 1.7. WBC 6.5, hemoglobin 11.5, platelet count 272. Lactic acid 1.5. Venous pH 7.37 and pCO2 in venous blood 37.   ASSESSMENT AND PLAN: A 44 year old male with past medical history of total pancreatectomy, total gastrectomy and partial resection of intestine  due to multiple complications of gastric bypass surgery in the past, came with uncontrolled diabetes, nausea, vomiting and fever.  1.  Hyperglycemia: The patient received 10 units subcutaneous and 10 units intravenous insulin  by Emergency Room. Will recheck the blood sugar  level after 1 hour and if it is still more than 400, we might need to start him on insulin drip. At this time, we will wait for the response of those insulins which were just given. Will keep him "nothing by mouth"  for tonight and give him intravenous  fluid normal saline  and recheck his  blood sugar levels.  2.  Acute renal failure: Creatinine 1.6 and he is dehydrated by his elevated hemoglobin level also compared to 10 days ago. We will give him intravenous fluids and recheck his renal function tomorrow morning. 3.  Acute viral gastroenteritis: The patient had fever,  nausea, vomiting and abdominal pain for the last 2 to 3 days, which we cannot explain by his hyperglycemia because he is not acidotic. This might be viral infection. We will wait for the blood sugar level to be collected. His WBC count is not raised, so we will just  send a blood culture, no antibiotics, and monitor for symptoms with intravenous fluids.  4.  Hypomagnesemia: Magnesium 1.7. Replace intravenously and recheck tomorrow.  5.  Hyponatremia: Sodium 129. Part of it may be due to hyperglycemia, pseudohyponatremia. We will give intravenous fluid normal saline and recheck tomorrow morning. 6.  Elevated lactic acid of 1.5: Part of it may be due to dehydration and renal failure. We would like to check it tomorrow morning after having him hydrated.   TOTAL TIME SPENT ON THIS ADMISSION: 50 minutes.   CODE STATUS: Full code.  ____________________________ Hope PigeonVaibhavkumar G. Elisabeth PigeonVachhani, MD vgv:jm D: 12/12/2012 20:10:16 ET T: 12/12/2012 20:32:18 ET JOB#: [id number]  cc: Hope PigeonVaibhavkumar G. Elisabeth PigeonVachhani, MD, <Dictator> Altamese DillingVAIBHAVKUMAR Mikala Podoll MD ELECTRONICALLY SIGNED 12/25/2012 14:16

## 2014-11-19 NOTE — Consult Note (Signed)
Brief Consult Note: Diagnosis: Adjustment disorder with depressed mood.   Patient was seen by consultant.   Consult note dictated.   Recommend further assessment or treatment.   Discussed with Attending MD.   Comments: [name] has no psychiatric past. He overdosed on multiple medications, he claims, unintentionally in the context of multiple stressors. He is no longer suicidal or homicidal. He is able to CFS.  PLAN: 1. The patient no longer meets criteria for IVC. I will terminate proceedings. Please discharge as appropriate.   2. The patient is not interested in pharmacotherapy but agrees to psychotherapy. Contact number for Bobby [name] [phone] given to the patient.  3. Will fsign off.  Electronic Signatures: Kristine LineaPucilowska, Ashleynicole Mcclees (MD)  (Signed 28-Oct-14 14:08)  Authored: Brief Consult Note   Last Updated: 28-Oct-14 14:08 by Kristine LineaPucilowska, Jacee Enerson (MD)

## 2014-11-19 NOTE — H&P (Signed)
PATIENT NAME:  Bobby [name], Bobby [name] MR#:  [id number] DATE OF BIRTH:  Jun 29, 1971  DATE OF ADMISSION:  04/23/2013  PRIMARY CARE PHYSICIAN:  Dr. Ellsworth Lennox  REFERRING EMERGENCY ROOM PHYSICIAN:  Dr. Minna Antis  CHIEF COMPLAINT:  Vomiting and diarrhea and excessive thirstiness.   HISTORY OF PRESENTING ILLNESS:  A 44 year old male with multiple past medical history including chronic dehydration, needing IV fluid administration, has total pancreatectomy, insulin-dependent diabetes mellitus, and chronic narcotic use, an atypical Mycobacterium infection and on IV antibiotic therapy, was discharged from hospital last week, and has followed with Dr. Cherylann Ratel as a routine in office, and his potassium was found being 6. So, he was advised to come to the Emergency Room yesterday. He came to ER, but in ER, his potassium was normal, and so he was given IV fluid for his vomiting and dehydration, and sent home. He continued feeling nauseous, was vomiting, and was having diarrhea, and today he was found being very lethargic, and his partner tried to check his blood sugar level, which was high, and so decided to bring him to the ER. His blood sugar was found more than 800 in the ER, and he was found having diabetic ketoacidosis, and so being admitted for further management under the hospitalist service. On further questioning, he confirms that he has diarrhea for the last 2 to 3 days, and he is also feeling very nauseous and has vomited multiple times, and he feels very dehydrated.   REVIEW OF SYSTEMS:  CONSTITUTIONAL:  Negative for fever, fatigue, or generalized weakness present. No pain or weight loss.  EYES:  No blurring, double vision, discharge, or redness.  ENT:  No tinnitus, ear pain, or hearing loss.  RESPIRATORY:  No cough, wheezing, hemoptysis, or shortness of breath.  CARDIOVASCULAR:  No chest pain, orthopnea, edema, or palpitations.  GASTROINTESTINAL:  The patient has nausea, vomiting, diarrhea, and mild  abdominal pain. No blood.  GENITOURINARY:  No dysuria, hematuria, or increased frequency.  ENDOCRINE:  No heat or cold intolerance. No increased sweating.  SKIN:  No acne, rashes, or lesions.  MUSCULOSKELETAL:  No pain or swelling in the joints.  NEUROLOGICAL:  No numbness, weakness, tremors, or headache.  PSYCHIATRIC:  No anxiety, insomnia, bipolar disorder.   PAST MEDICAL HISTORY:  Type 1 diabetes mellitus, chronic dehydration and IV fluid administration, pancreatectomy, peripheral diabetic neuropathy, hypertension, chronic abdominal pain, as well as chronic narcotic use, and atypical Mycobacterium infection.   PAST SURGICAL HISTORY:   1.  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  2.  Gastric bypass surgery.  3.  Surgery for volvulus.  4.  G-tube placement in the past.   ALLERGIES:  MORPHINE, TYLENOL, NONSTEROIDAL ANTI-INFLAMMATORY DRUGS.   SOCIAL HISTORY:  No history of smoking, drinking alcohol, or illegal drug use. He lives with his partner/boyfriend.   FAMILY HISTORY:  Both parents have diabetes mellitus.   HOME MEDICATIONS:  1.  Ambien 10 mg oral once a day.  2.  Amlodipine 10 mg once a day.  3.  Avelox 400 mg IV once a day.  4.  Cal-Citrate with vitamin D 1 tablet 630 mg 2 times a day.  5.  Carvedilol 12.5 mg 2 times a day.  6.  Colace 100 mg oral capsule 2 times a day.  8.  Fentanyl patch transdermal every 48 hours.  9.  Gabapentin 400 mg capsule 2 capsules 3 times a day.  10.  Humalog 6 units 3 times a day and as per sliding scale.  11.  Insulin  glargine 7 units subcutaneous once a day in the morning and 5 units subcutaneous once a day at bedtime.  12.  MiraLax oral powder for constipation 17 grams as needed.  13.  Oxycodone 10 mg oral tablet every 4 hours as needed for pain.  14.  Promethazine 25 mg rectal suppository every 6 hours as needed for nausea and vomiting.  15.  Sucralfate 20 mL orally 4 times a day at bedtime.  16.  Vitamin B12 one 1000 mcg oral tablet  once a day.  17.  Vitamin D3 one 50,000 international units capsule on Monday, Wednesday, and Friday.  18.Pancreatic enzyme 25,000 units 3 capsules 3 times a day with meals, 1 to 2 capsules with snacks.  19.  Zithromax 250 mg intravenous once a day.  20.  Zofran delayed-release tablet 3 times a day as needed for nausea and vomiting.   PHYSICAL EXAMINATION:  VITAL SIGNS:  In ER, temperature 98.5, pulse 114, respirations 30, blood pressure 136/54, and pulse ox 100% on room air.  GENERAL:  The patient is fully alert and oriented to time, place, and person. He appears to be extremely dry and slightly anxious because of his sickness.  HEENT:  Head and neck atraumatic. Conjunctivae pink. Oral mucosa severely dry.  NECK:  Supple. No JVD.  RESPIRATORY:  Bilateral clear and equal air entry.  CARDIOVASCULAR:  S1, S2 present. Tachycardia. No murmur.  ABDOMEN:  Soft, nontender, bowel sounds present. No organomegaly.  SKIN:  No rashes.  LEGS:  No edema.  NEUROLOGICAL:  Power 5/5. Follows commands. Moves all limbs. No tremors.  PSYCHIATRIC:  Appears to be anxious at this time.   LABORATORY RESULTS:  Glucose 844, BUN 27. Creatinine 2.45, which was 1.79 yesterday. Sodium 122, potassium 5.1, chloride 89, and CO2 of 10. Anion gap of 23, total protein 8.3, bilirubin 0.4, alkaline phosphatase 148, SGOT 75, and SGPT 60. WBC 8.9, hemoglobin 11.5, platelet count 318, MCV 91. Urinalysis is grossly negative, and pH on ABG 7.23, pCO2 less than 19, and pO2 of 125 on room air   ASSESSMENT AND PLAN:  A 44 year old male with multiple past medical histories and is on current therapy of mycobacterial infection with IV antibiotic with PICC line, started having nausea and vomiting and was getting severely dehydrated and drowsy, and found having diabetic ketoacidosis.  1.  Diabetic ketoacidosis. We will put him on insulin drip and IV fluid protocol for diabetic ketoacidosis in CCU and continue monitoring blood sugar and his  acidosis regularly every 4 hours.  2.  Diarrhea. The patient was on chronic antibiotic therapy since a long time, so we will check stool for Clostridium difficile.  3.  Acute renal failure. This is due to dehydration. We will give him IV fluid and monitor.  4.  Hyponatremia. His sodium level is low. This might be pseudohyponatremia because of his blood sugar level 800. We will give IV fluid and then monitor later on.  5.  Mycobacterium infection. He is on IV azithromycin and Avelox. We will continue the same as home dose, as he was supposed to take 4 weeks.  6.  Total pancreatectomy and on pancreatic enzyme replacement. We will continue the same.  7.  Chronic pain. We will continue the pain management over here.   Condition is critical due to severe acidosis and diabetic ketoacidosis and severe dehydration. We will keep him in critical care unit and continue monitoring. Code status is FULL CODE, and plan explained to his partner, who is  present in the room.   CRITICAL CARE TIME SPENT:  60 minutes on this admission.     ____________________________ Hope PigeonVaibhavkumar G. Elisabeth PigeonVachhani, MD vgv:ms D: 04/23/2013 18:59:52 ET T: 04/23/2013 19:26:54 ET JOB#: [id number]  cc: Hope PigeonVaibhavkumar G. Elisabeth PigeonVachhani, MD, <Dictator> Altamese DillingVAIBHAVKUMAR Abbas Beyene MD ELECTRONICALLY SIGNED 04/24/2013 18:41

## 2014-11-19 NOTE — Consult Note (Signed)
PATIENT NAME:  Bobby [name], Bobby [name] MR#:  [id number] DATE OF BIRTH:  1971/04/21  DATE OF CONSULTATION:  03/02/2013  REFERRING PHYSICIAN: Enedina FinnerSona Patel, MD   CONSULTING PHYSICIAN:  A. Wendall MolaMelissa Charde Macfarlane, MD  CHIEF COMPLAINT: Uncontrolled diabetes.   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 He was admitted on August 2nd with nausea and vomiting and abdominal pain. The patient has a history of chronic abdominal pain dating back to prior abdominal surgeries. He is on chronic narcotics. He has frequent nausea. He has had labile blood sugars throughout admission. He had low blood sugars yesterday and the day prior. In the last 24 hours, blood sugars have been in the range of 31 to 294. He is currently receiving Lantus 5 units each morning as well as lispro 6 units t.i.d. before meals with a lispro sliding scale. The patient is currently n.p.o. for a scheduled CT scan earlier today. He did receive scheduled lispro at breakfast, but then was made n.p.o., so he has not yet eaten breakfast. He does not feel unwell at this time. Again he has his chronic nausea, but this does not seem to be much worse from prior. On admission, he was noted to have acute renal failure, that is improved with IV fluids.   PAST MEDICAL HISTORY: 1.  Insulin-dependent diabetes.  2.  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  3.  Chronic abdominal pain.  4.  Diabetic peripheral neuropathy.  5.  Hypoglycemia unawareness. 6.  Vitamin D deficiency.  7.  Peptic ulcer disease.  8.  History of migraine headaches.  9.  Hiatal hernia.   PAST SURGICAL HISTORY: 1.  Gastric bypass July 2004.  2.  Partial pancreatectomy May 2008.  3.  Completion pancreatectomy July 2009. 4.  Vagotomy June 2011.   CURRENT MEDICATIONS: 1.  Lantus 5 units once daily.  2.  Lispro 6 units t.i.d. before meals.  3.  Lispro insulin  sliding scale.  4.  Amlodipine 10 mg daily.  5.  Calcium citrate plus D 2 tabs b.i.d.  6.  Coreg 12.5 mg b.i.d.  7.  B12 1000 mcg daily.  8.  Vitamin D 50,000 units 3 times weekly.  9.  Gabapentin 800 mg t.i.d.  10.  Hydralazine 50 mg b.i.d.  11.  Multivitamin 1 tab daily.  12.  Pancrelipase 12,000 units, 6 caps t.i.d. with meals.  13.  Carafate 1 gram q.i.d.  14.  Thiamine 100 mg daily.  15.  Fentanyl patch 100 mcg q. 2 days.   ALLERGIES: 1.  MORPHINE.  2.  CORICIDIN.  3.  TYLENOL.   SOCIAL HISTORY: The patient lives with his partner. Denies tobacco use.   FAMILY HISTORY: Mother and father had history of diabetes.   REVIEW OF SYSTEMS:  GENERAL: Weight has been stable. He has had low-grade fevers.  HEENT: Denies blurred vision. Denies sore throat.  NECK: Denies neck pain, denies dysphagia.  CARDIAC: Denies chest pain. Denies palpitations.  PULMONARY: Denies cough. Denies shortness of breath.  ABDOMEN: Reports abdominal pain, which is chronic in nature and recently unchanged. Has mild nausea. Tolerating his diet.  EXTREMITIES: Denies leg swelling.  GENITOURINARY: Denies dysuria or hematuria.  ENDOCRINE: Denies heat or cold intolerance.  NEUROLOGIC: Denies recent falls or tremor.   HEMATOLOGIC: Denies recent bleeding or easy bruisability.   PHYSICAL EXAMINATION: VITAL SIGNS: Height 70.9 inches, weight 187 pounds. BMI 26. Temperature 98.4, pulse 90, respirations  20, blood pressure 136/85, pulse oximetry 92% on room air.  GENERAL: Chronically ill-appearing white male in no acute distress.  HEENT: EOMI. Oropharynx is clear. Mucous membranes moist.  NECK: Supple. No thyromegaly.  CARDIAC: Regular rate and rhythm. No murmur.  PULMONARY: Clear to auscultation bilaterally. No wheeze.  ABDOMEN: Nondistended, diffuse nonfocal tenderness without guarding or rebound. Positive bowel sounds.  EXTREMITIES: No edema is present.  SKIN: No rash or dermatopathy is present.  PSYCHIATRIC:  Alert and oriented x 3.  NEUROLOGIC: Nonfocal. No tremor.  SKIN: No rash or dermatopathy is present.  LYMPH: No submandibular or anterior cervical lymphadenopathy is present.   LABORATORY DATA: Glucose 364, BUN 23, creatinine 2.0, sodium 138, potassium 4.4, chloride 108, bicarb 24, anion gap 6, calcium 8.3. WBC 7.0, hematocrit 26.4, platelets 235, albumin 2.7, AST 17, ALT 27.  02/09/2013, hemoglobin A1c 10.6%.   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 Has had challenging blood sugar control in the past, both with hypoglycemia and severe hyperglycemia during hospitalizations. I have only seen him during hospitalizations and never in outpatient clinic. Ultimately, he may benefit from an insulin pump and perhaps a glucose sensor as well. For now, I recommend giving Lantus, a fairly low dose due to his tendency towards hypoglycemia.  Can try 5 units b.i.d. of Lantus. Continue Humalog; a dose of 3 units t.i.d. before meals is recommended plus Humalog at meals and bedtime sliding scale, 1 unit per 50 over a target of 150. Also challenging is his diet as he typically wants to eat ad lib and requests double portion sizes. It has been difficult to get an accurate assessment of carb intake in the past due to the diet. Ultimately, he and I are in agreement that putting him on a strict diet during hospitalizations does not achieve much long term as he goes home and eats ad lib.  Ultimately, it would be best to establish good outpatient followup again, possibly getting him a pump and/or sensor for good blood sugar management to be achieved.    I will follow along with you.   ____________________________ A. Wendall Mola, MD ams:cb D: 03/02/2013 16:58:56 ET T: 03/02/2013 21:01:29 ET JOB#: [id number]  cc: A. Wendall Mola, MD, <Dictator> Macy Mis MD ELECTRONICALLY SIGNED 03/04/2013 17:02

## 2014-11-19 NOTE — Consult Note (Signed)
PATIENT NAME:  Bobby [name], Bobby [name] MR#:  [id number] DATE OF BIRTH:  21-Apr-1971  DATE OF CONSULTATION:  04/27/2013  REFERRING PHYSICIAN:  Devona Konig, MD CONSULTING PHYSICIAN:  A. Lavone Orn, MD  REASON FOR CONSULTATION: Type 1 diabetes.   HISTORY OF PRESENT ILLNESS: This is a 44 year old male, who was admitted on 09/25 with diabetic ketoacidosis. He initially presented with blood sugars over 800 and altered mental status. He was stabilized in the intensive care unit. He is currently on an insulin regimen of NovoLog 70/30 mix, 22 units b.i.d. and NovoLog insulin sliding scale 2 units per 50 of blood sugar over a target of 150. Blood sugars have been fluctuating in the range of 48 to 278 over the last 24 hours. He has chronic abdominal pain and chronic nausea. Today, he did have nausea with emesis. Appetite is poor.   The patient is well known to me. He has had type 1 diabetes due to prior pancreatectomy. He has a complicated past medical history and has had multiple hospitalizations for issues with chronic pain, uncontrolled diabetes and diabetic ketoacidosis as well as more recently with mycobacterial infection for which he is receiving IV antibiotics. This is managed by infectious disease at [facility].   PAST MEDICAL HISTORY:  1.  Type 1 diabetes.  2.  Diabetic peripheral neuropathy.  3.  Recurrent diabetic ketoacidosis.  4.  Hypoglycemia unawareness.  5.  Hypertension.  6.  Vitamin D deficiency.  7.  Peptic ulcer disease.  8.  Pancreatic exocrine insufficiency.  9.  Chronic abdominal pain.  10. Migraine headaches.  11. Hiatal hernia.  12. 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  13. Renal insufficiency with a history of recurrent acute renal failure.   PAST SURGICAL HISTORY:  1.  Gastric bypass, July 2004.  2.  Partial pancreatectomy, May 2008.  3.  Completion pancreatectomy, July 2009.  4.  Vagotomy, June 2011.   HOME MEDICATIONS:  1.  Lantus 7 units in the morning and 4  units at bedtime.  2.  Humalog 6 units t.i.d. before meals plus sliding scale Humalog. 3.  For remainder of home meds, please see the dictated history and physical.   SOCIAL HISTORY: The patient lives with his partner, Roselyn Reef. No tobacco or alcohol use. He is on disability.   FAMILY HISTORY: Positive for diabetes.   ALLERGIES: MORPHINE, TYLENOL, NSAIDS.  REVIEW OF SYSTEMS:  GENERAL: No weight loss. He has had night sweats with chills for the last couple weeks. HEENT: No blurred vision. No sore throat. NECK: No neck pain.  CARDIAC: No chest pain or palpitations. PULMONARY: No cough or shortness of breath.  ABDOMEN: He has chronic abdominal pain. He has chronic nausea with frequent vomiting. Last emesis was today. ENDOCRINE: No heat or cold intolerance. EXTREMITIES: Denies leg swelling. SKIN: Denies rash or recent skin changes. HEMATOLOGIC: Denies easy bruisability or recent bleeding.   PHYSICAL EXAMINATION:  VITAL SIGNS: Height 70.9 inches, weight 161 pounds, BMI 22.5, temperature 98.9, pulse 90, respirations 18, blood pressure 155/87, pulse oximetry is 97% on room air.  GENERAL: Chronically ill-appearing white male in no acute distress.  HEENT: EOMI. Oropharynx is clear. Mucous membranes moist.  NECK: Supple. No thyromegaly is present.  LYMPH: No submandibular or anterior cervical lymphadenopathy.  CARDIAC: Regular rate and rhythm. No audible murmur.  PULMONARY: Clear to auscultation bilaterally. No wheeze. Good inspiratory effort.  ABDOMEN: Diffuse mild tenderness, no guarding. No rebound. Positive bowel sounds.  EXTREMITIES: No edema is present.  PSYCHIATRIC: Alert  and oriented, calm and cooperative.  NEUROLOGIC: No gross deficits.   LABORATORY: Glucose 81, BUN 16, creatinine 2.08, sodium 142, potassium 4.3, chloride 112, eGFR 39, magnesium 1.2, albumin 2.8, AST 52, ALT 42. WBC 6.5, hematocrit 25.8.   ASSESSMENT: A 44 year old male with multiple medical problems to include diabetes  secondary to prior pancreatectomy. Diabetes is chronically uncontrolled. Recent episode of diabetic ketoacidosis, which prompted this hospitalization, has now resolved. Diabetes is complicated by peripheral neuropathy and hypoglycemia unawareness.   RECOMMENDATIONS:  1.  We will stop the NovoLog 70/30 mix and replace with his typical regimen of Lantus and Humalog.  2.  Start Lantus 7 units in the morning and 4 units at bedtime.  3.  Start Humalog 6 units t.i.d. before meals.  4.  Change sliding scale to Humalog. Dose 1 unit per blood sugar of 50 over a target of 150.  5.  I agree with regular diet, he always is asking for larger portion sizes and some flexibility with his meals and that has been fairly reasonable in the past.  6.  The patient needs good outpatient diabetes followup. He did not keep a scheduled office visit with me last month. He intends to establish with Dr. Clarita Leber at [facility] endocrinology and has an appointment with her on 06/18/2013. I again encouraged him to keep that appointment hopefully to improve glycemic control through regular outpatient followup.   Thank you for the kind request for consultation. I will follow along with you.  ____________________________ A. Lavone Orn, MD ams:aw D: 04/27/2013 13:07:05 ET T: 04/27/2013 13:41:44 ET JOB#: [id number]  cc: A. Lavone Orn, MD, <Dictator> Sherlon Handing MD ELECTRONICALLY SIGNED 04/29/2013 16:42

## 2014-11-19 NOTE — H&P (Signed)
PATIENT NAME:  Bobby [name], Bobby [name] MR#:  [id number] DATE OF BIRTH:  13-Sep-1970  DATE OF ADMISSION:  04/10/2013  PRIMARY CARE PHYSICIAN:  Dr. Rolm GalaHeidi Grandis.  The patient is a 44 year old Caucasian male with past medical history significant for history of chronic dehydration needing IV fluid administration with ER visits at least once a week, as well as history of gastrectomy and pancreatectomy, admission to the hospital with community-acquired pneumonia, presents back to the hospital just after 1 [facility] of [facility] where he was treated for what sounds MAI bacteremia as well as pneumonia. Apparently, the patient was admitted approximately 3 weeks ago to [facility] where he was treated with antibiotics. He was noted to have bacteremia with mycobacteria. Mycobacteria was found to be in the blood as well as in his lungs. According to the patient, the [facility] felt that he may aspirated mycobacteria, and that is why it ended up actually in the blood.  Now he is on Avelox as well as azithromycin IV, to be continued for 8 total weeks, through the end of October. He comes to the hospital, to [facility] because of worsening nausea, vomiting, as well as some abdominal pains, feeling run down, no energy and having profuse sweats at nighttime over the past 2 days. He has been having worsening pains in his abdomen as well. He has been sweating profusely for the past 5 nights. He was brought to the Emergency Room and was found to be dehydrated with creatinine level of 2.59, sodium 128 and potassium of 5.4. Hospitalist services were contacted for admission. His bicarbonate level was low at 20.   PAST MEDICAL HISTORY: Significant for history of multiple medical problems including chronic dehydration needing IV fluid administration. He was seen by Dr. Orlie DakinFinnegan recently, who felt the patient would [facility]  a week. History of type 1 diabetes mellitus, poorly controlled; peripheral diabetic neuropathy, gastroparesis, hypertension, chronic abdominal pain as well as chronic narcotic use, multiple hospitalizations for different medical issues, peptic ulcer disease, migraine headaches, hypertension and anxiety.   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   ALLERGIES: MORPHINE, TYLENOL, NONSTEROIDAL ANTIINFLAMMATORY MEDICATIONS, AS WELL AS CORICIDIN.  MEDICATIONS: According to medical records, the patient is on Ambien 10 mg p.o. at bedtime as needed, amlodipine 10 mg daily, Avelox 400 mg IV daily, Caltrate with vitamin D 1 tablet twice daily, Carafate 1 gram 2 tablets 3 times daily, carvedilol 12.5 mg p.o. twice daily, Colace 100 mg p.o. twice daily as needed. Diovan 80 mg p.o. daily, fentanyl 100 mcg topically every 48 hours, gabapentin 400 mg 2 capsules 3 times daily, Humalog 16 units 3 times daily with meals, insulin Lantus 4 units at bedtime and 10 units in the morning, MiraLAX 17 grams daily as needed, multivitamins once daily,  Zofran 4 mg p.o. every 6 hours as needed, oxycodone 10 mg every 4 hours as needed, promethazine 25 mg rectally 1 suppository rectally every 6 hours as needed, vitamin B12 1000 mcg orally daily, vitamin D3 50 units capsule Mondays, Wednesdays, Fridays, Zenpep 25,000/136,000 units/85,000 units 3 capsules 3 times daily with meals and 1 to 2 capsules with snacks, Zithromax 250 mg IV daily, Zofran 8 mg every 8 hours as needed and Zofran 4 mg p.o. every 4 to 6 hours as needed.   SOCIAL HISTORY: No history  of smoking, drinking alcohol or illicit drugs. Lives with his boyfriend.   FAMILY HISTORY: Both parents have diabetes mellitus.   REVIEW OF SYSTEMS:  CONSTITUTIONAL:  The patient admits to having fevers and chills, feeling feverish and chilly over the past few nights,  sweating and weak, pains in his abdomen. He thinks that he lost some weight, drenching sweats at nighttime, some blurring of vision, as well as double vision intermittently, but nothing recently. Some cough but no significant phlegm production. Abdominal pain as well as worsening diarrhea, loose stools numerous times, last one was today in the morning, vomiting at least 3 times a day. Decreased frequency of urination. Otherwise, denies any significant weight loss or gain.  EYES:  Denies any glaucoma or cataracts.  ENT: Denies any tinnitus, allergies, epistaxis, sinus tenderness or difficulty swallowing.  RESPIRATORY: Denies any wheezes.  Denies any COPD. CARDIOVASCULAR: Denies chest pains, orthopnea, edema, arrhythmias, palpitations or syncope.  GASTROINTESTINAL: Denies (Dictation Anomaly) diarrhea, constipation, hematemesis or rectal bleeding.  GENITOURINARY: Denies dysuria, incontinence. ENDOCRINOLOGY: Denies any polydipsia, nocturia, thyroid problems, heat or cold intolerance or thirst.  HEMATOLOGIC: Denies any anemia, easy bruising, bleeding, swollen glands. SKIN:  Denies any acne, rash, lesions, change in moles. MUSCULOSKELETAL:  Denies arthritis, cramps, swelling.  NEUROLOGICAL: No numbness, epilepsy, tremor.  PSYCHIATRIC: Denies anxiety, insomnia or depression.  PHYSICAL EXAMINATION:  VITAL SIGNS:  On arrival to the hospital, temperature is 98.7, pulse 72, respiratory rate 20, blood pressure 148/84, saturation was 97% on room air.  GENERAL:  This is a well-nourished pale Caucasian male in no significant distress, sitting on the stretcher.  HEENT: His pupils are equal, reactive to light. Extraocular movements intact. No icterus or conjunctivitis.  He has normal hearing. No pharyngeal erythema. Mucosa is moist.  NECK: No masses. Supple, nontender. Thyroid is not enlarged. No adenopathy. No JVD or carotid bruits bilaterally. Full range of motion.  LUNGS: Clear to auscultation in all fields. No  rales, rhonchi, diminished breath sounds or wheezing. No labored inspirations, increased effort, dullness to percussion or overt respiratory distress.  CARDIOVASCULAR: S1, S2 appreciated. No murmurs, gallops or rubs. Rhythm was regular. PMI not lateralized.  Chest is nontender to palpation.  1+ pedal pulses. No lower extremity edema, calf tenderness or cyanosis.  ABDOMEN: Soft, nondistended, nontender. Mild discomfort was noted in the epigastrium area. No rebound or voluntary guarding was noted. Multiple scars were noted, as well as skin folds with fatty tissue underneath. No hepatosplenomegaly or masses were noted.  RECTAL: Deferred.  MUSCLE STRENGTH: Able to move all extremities. No cyanosis,  degenerative joint disease, or kyphosis. Gait is not tested.  SKIN: Did not reveal any rashes, lesions, erythema, nodularity or induration. It was warm and dry to palpation.  LYMPHATIC: No adenopathy in the cervical region.  NEUROLOGIC: Cranial nerves grossly intact. Sensory is intact. No dysarthria or aphasia.  PSYCHIATRIC:  The patient is alert and oriented to time, person, place. Cooperative. Memory is good. No significant confusion, agitation or depression.  LABORATORY  DATA:  BMP done earlier today showed glucose of 262. BUN and creatinine were 23 and 2.59. Sodium 128, potassium 5.4, bicarbonate 20. Estimated GFR for a non-African American would be 30. Lipase level is decreased at 29. Liver enzymes: Albumin level of 3.3, alkaline phosphatase 138, AST elevation of 82. CK total was 57. White blood cell count is 4.3, hemoglobin 10.2, platelet count 252. ABGs were done on room air: PH of 7.41, pCO2 of 26, pO2 of 127, saturation was 98.6% with  bicarbonate level of 16.5 calculated. Lactic acid 1.1.   RADIOLOGIC STUDIES: None.   EKG showed normal sinus rhythm at 81 beats per minute, normal axis. No acute ST-T changes were noted.   ASSESSMENT AND PLAN: 1.  Gastroenteritis of unclear etiology, possibly related  to medication side effect versus medication-induced infection such as bacterial overgrowth with Clostridium diff versus some other reason including nonspecific exacerbation of chronic medical problems. We will get stool cultures. We will also get ID involved. We will continue IV fluids, as well as supportive therapy. 2.  Acute on chronic renal failure. We will continue IV fluids at high rate. 3.  Hyperglycemia in a diabetic patient, likely due to p.o. intake control and questionable compliance, as well as diarrhea. We may initiate the patient on insulin drip if blood glucose levels remain high despite  IV fluid administration.  We will resume Lantus insulin when his sugars  are somewhat improved.  I will also continue sliding-scale insulin.  We will get a hemoglobin A1c.  4.  Mycobacterium avium-intracellulare infection bacteremia per history. We will continue Zithromax as well as Avelox IV. According to [facility], recommendation is for  7 more weeks. We will also get ID consult and we will get medical records from [facility].  5.  Chronic pain syndrome. We will continue fentanyl patch, Dilaudid IV until the patient is able to take p.o. medications.  6.  Hyperkalemia.  We will get potassium levels stat. We will continue insulin support.   TIME SPENT: 50 minutes.    ____________________________ Katharina Caper, MD rv:dmm D: 04/10/2013 18:11:00 ET T: 04/10/2013 20:05:30 ET JOB#: [id number]  cc: Katharina Caper, MD, <Dictator> Letitia Caul, MD Shereena Berquist MD ELECTRONICALLY SIGNED 05/19/2013 22:34

## 2014-11-19 NOTE — Consult Note (Signed)
Brief Consult Note: Diagnosis: abdominal pain, nausea and vomiting.   Patient was seen by consultant.   Comments: Patietn seen and examined, full GI consult to follow.  Patietn presenting with hyperglycemia, n/v and epigastric pain.  He has a complex med/surg history including gastric bypass, hyperinsulinemic hypoglycemia (nesidioblastosis) and multiple abdominal proceedures following to include total gastrectomy and total pancreatectomy.  [facility].  Recommend endocrine evaluation and management before gi eval, EGD versus UGIS/SBS.  Electronic Signatures: Barnetta ChapelSkulskie, Jalexus Brett (MD)  (Signed 22-Jun-14 21:31)  Authored: Brief Consult Note   Last Updated: 22-Jun-14 21:31 by Barnetta ChapelSkulskie, Natalie Mceuen (MD)

## 2014-11-19 NOTE — Consult Note (Signed)
Bobby [name] [name] NAME:  Bobby [name] Bobby [name] [name], Bobby [name] Bobby [name] [name] MR#:  [id number] DATE OF BIRTH:  [birth year]  DATE OF CONSULTATION:  05/25/2013  REFERRING PHYSICIAN:  Marland Mcalpine A. Ellsworth Lennox, MD CONSULTING PHYSICIAN:  Keneisha Heckart B. Lerone Onder, MD  REASON FOR CONSULTATION: To evaluate Bobby [name] [name] after a suicide attempt.   IDENTIFYING DATA: Bobby [name] Bobby [name] [name] is a 44 year old male with no past psychiatric history.   CHIEF COMPLAINT: "I didn't do it on purpose."   HISTORY OF PRESENT ILLNESS: Bobby [name] Bobby [name] [name] was [name] un[facility]. According to his mother, he has been depressed and suicidal lately and Bobby [name] mother believes that her son committed suicide. She found an empty Ambien bottle and according to her 30 pills were missing. Bobby [name] Bobby [name] [name] also had very low blood glucose and there was a worry that he could have overdosed on insulin as well. I saw Bobby [name] Bobby [name] [name] in [name] [name] following day. Bobby [name] Bobby [name] [name] adamantly denies that it was a suicide attempt. He does admit and that he has been arguing with his partner of 15 years but did not feel that it was a suicide attempt. He did not really have a good explanation how 30 Ambien pills disappeared from Bobby [name] bottle. He denied using insulin. He has been under considerable stress lately as for Bobby [name] past 8 weeks he had daily antibiotic infusion at home to treat an infection. He felt mentally and physically drained.  This is over now and he hopes that his physical as well as mental recovery has just begun. He is glad to be alive, denies suicidal or homicidal ideations. He denies any depressive symptoms either. There is no history of psychosis. He denies alcohol, illicit substance or prescription pill abuse. He admits that at times he feels down but never to Bobby [name] point to even seek psychiatric help. He denies ever attempting suicide. He has very limited social support. His family is in [place] and mother in [place]. He not always can confide in his partner. He is ready to start psychotherapy.   PAST PSYCHIATRIC  HISTORY: None. He has never been hospitalized, never taken any medications and no suicide attempts.   FAMILY PSYCHIATRIC HISTORY: Mother with depression.   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   ALLERGIES: CORICIDIN, MORPHINE, TYLENOL.   MEDICATIONS: At Bobby [name] time of consultation Zithromax injection, heparin injection, metoprolol  2.5 mg IV as needed, Zofran injection, Norvasc 10 mg daily, fentanyl patch 100 mcg, Haldol IV as needed, Apresoline 50 mg twice daily, NovoLog 6 units before meals, Levemir 5 units at bedtime, sliding scale insulin, Roxicodone 10 mg every 4 hours as needed, Phenergan as needed, pantoprazole 40 mg daily.   REVIEW OF SYSTEMS: CONSTITUTIONAL: No fevers or chills. Positive for fatigue and some weight loss.  EYES: No double or blurred vision.  ENT: No hearing loss.  RESPIRATORY: No shortness of breath or cough.  CARDIOVASCULAR: No chest pain or orthopnea.  GASTROINTESTINAL: Positive for abdominal pain.  GENITOURINARY: No incontinence or frequency.  ENDOCRINE: History of pancreatectomy and diabetes.  LYMPHATIC: No anemia or easy bruising.  INTEGUMENTARY: No acne or rash.  MUSCULOSKELETAL: No muscle or joint pain.  NEUROLOGIC: No tingling or weakness.  PSYCHIATRIC: See history of present illness for details.   PHYSICAL EXAMINATION: VITAL SIGNS: Blood pressure 164/102, pulse  138, respirations 38, temperature 98.5.  GENERAL: This is a slender male in no acute distress. Bobby [name] rest of Bobby [name] physical examination is deferred to his primary attending.   LABORATORY DATA: Chemistries: Blood  glucose 223, BUN 17, creatinine 1.99, sodium 136, potassium 3.4. Blood alcohol level on admission 0. LFTs within normal limits except for alkaline phosphatase of 137 and AST of 58. TSH 5.63. Urine tox screen negative for substances. CBC: White blood count 7.6, hemoglobin 10.6, hematocrit 31.4, platelets 273. Urinalysis is not suggestive of urinary tract infection.   EKG: Sinus tachycardia, possible left atrial enlargement, nonspecific ST abnormality, abnormal EKG.   MENTAL STATUS EXAMINATION: Bobby [name] Bobby [name] [name] is alert and oriented to person, place, time and situation. He is pleasant, polite and cooperative. He is meticulously groomed. He is wearing private pajamas. He maintains good eye contact. His speech is of normal rhythm, rate and volume. Mood is fine with full affect. Thought process is logical and goal oriented. Thought content: He denies suicidal or homicidal ideation. There are no delusions or paranoia. There are no auditory or visual hallucinations. His cognition is grossly intact. His insight and judgment are fair.   DIAGNOSES: AXIS I: Adjustment disorder with depressed mood.  AXIS II: Deferred.  AXIS III: Multiple medical problems, see above.  AXIS IV: Physical illness, primary support, relationship conflict.  AXIS V: GAF 55.    PLAN:  1.  Bobby [name] Bobby [name] [name] no longer meets criteria for involuntary commitment. I will terminate proceedings. Please discharge as appropriate. I will discontinue sitter.  2.  Bobby [name] Bobby [name] [name] is not interested in pharmacotherapy but is interested in psychotherapy. I will provide him a number of a therapist in our area who works with his insurance.  3.  I will follow up.    ____________________________ Braulio ConteJolanta B. Jennet MaduroPucilowska, MD jbp:cs D: 05/26/2013 14:27:00 ET T: 05/26/2013 15:21:37 ET JOB#: [id number]  cc: Landrey Mahurin B. Jennet MaduroPucilowska, MD, <Dictator> Shari ProwsJOLANTA B Kendalynn Wideman MD ELECTRONICALLY SIGNED 05/28/2013 9:04

## 2014-11-19 NOTE — Discharge Summary (Signed)
PATIENT NAME:  Bobby [name], [name] MR#:  [id number] DATE OF BIRTH:  September 25, 1970  DATE OF ADMISSION:  12/21/2012 DATE OF DISCHARGE:  12/25/2012  CONSULTATION: Endocrinology, Dr. Tedd SiasSolum.   DISCHARGE DIAGNOSES:  1. Acute renal failure, dehydration, nausea and vomiting, possibly due to gastroparesis.  2. Uncontrolled type 1 diabetes mellitus with hypoglycemia and hyperglycemia.   DISCHARGE MEDICATIONS:  1. Ambien 10 mg daily.  2. Vitamin C 1000 mg once a day.  3. Vitamin B12 1000 mcg daily.  4. Gabapentin 800 mg t.i.d.  5. Oxycodone 10 mg every 4 hours.  6. Vitamin D3 50,000 units 3 times a week.  7. Zenpep 25,000/136,000 units 3 caps t.i.d. with meals, 1 to 2 caps with snacks.  8. Fentanyl patch  mcg every 48 hours.  9. Calcium carbonate.  10. Diovan 80 mg daily.  11. Multivitamin 1 tab daily.  12. Amlodipine 1 tab daily.  13. Thiamine 100 mg daily.  14. Insulin lispro 3 units t.i.d. before meals.  15. Lantus 9 units q.a.m.  16. Promethazine 25 mg p.r.n. 3 times a day.   HOSPITAL COURSE: Jonny RuizJohn was admitted from the Emergency Room with the complaint of recurrence of his nausea and vomiting. In the Emergency Room, he was found to be dehydrated and hypotensive with evidence of acute kidney injury. Please refer to the history and physical for full details. The patient was hydrated with intravenous fluids, normal saline. His nausea and vomiting persisted for which he received antiemetics. The patient'[name] glucose remained persistently low despite adjustment of his insulin and boluses of D50. An endocrinology consultation was placed to Dr. Tedd SiasSolum who reduced his total insulin dosage with improvement in his blood glucose. The patient'[name] nausea and vomiting is chronic, presumed due to gastroparesis from his diabetes and multiple abdominal surgeries following his gastric bypass surgery. The patient was discharged to home in stable condition after his nausea and vomiting were controlled and his glucose remained  stable over 24 hours.   DISCHARGE INSTRUCTIONS:  DIET: Diabetic diet.  ACTIVITY: No restrictions.  FOLLOWUP: In 1 to 2 weeks with Dr. Ellsworth Lennoxejan-Sie. Endocrinology followup with [facility] is pending.   ____________________________ Silas FloodSheikh A. Ellsworth Lennoxejan-Sie, MD sat:gb D: 01/11/2013 14:24:00 ET T: 01/12/2013 01:24:27 ET JOB#: [id number]  cc: Sheikh A. Ellsworth Lennoxejan-Sie, MD, <Dictator> Charlesetta GaribaldiSHEIKH A TEJAN-SIE MD ELECTRONICALLY SIGNED 01/12/2013 13:18

## 2014-11-19 NOTE — Discharge Summary (Signed)
PATIENT NAME:  Bobby [name], Bobby [name] MR#:  [id number] DATE OF BIRTH:  04-09-1971  DATE OF ADMISSION:  12/12/2012 DATE OF DISCHARGE:  12/14/2012  PRIMARY CARE PHYSICIAN:  Dr. Ellsworth Lennoxejan-Sie   ADMITTING PHYSICIAN:  Dr. Elisabeth PigeonVachhani   DISCHARGE DIAGNOSES:   1.  Dehydration,  uncontrolled diabetes, acute kidney injury.  2.  Chronic pain, type 1 diabetes mellitus, secondary to pancreatectomy 3.  Morbid obesity status post gastric bypass surgery.  4.  Hypertension.   HOSPITAL COURSE: This gentleman was admitted through the Emergency Room where he presented with dizziness, nausea and vomiting and was noted to be hyperglycemic without evidence of diabetic ketoacidosis, dehydrated with evidence of acute kidney injury. Please refer to the history and physical for details. The patient was admitted to the medical floor and was placed on intravenous fluids, normal saline high with resulting in improvement in his creatinine. His some blood sugars normalized with fluid administration and resumption of his usual dose of insulin. The patient'Afra Tricarico nausea and vomiting also resolved. No acute focal infection was found. The patient was counseled again importance of compliance with medication to which he acknowledged.   DISCHARGE MEDICATIONS: Please refer to discharge medical reconciliation. No medication adjustments were made, neither were any new prescriptions added.   DIET: Low sodium, ADA diet.   ACTIVITY: No restrictions.   FOLLOW-UP: Dr. Ellsworth Lennoxejan-Sie in one week.   DISCHARGE PROCESS TIME SPENT: 34 minutes   ____________________________ Silas FloodSheikh A. Ellsworth Lennoxejan-Sie, MD sat:cc D: 12/14/2012 13:00:38 ET T: 12/14/2012 21:50:46 ET JOB#: [id number]  cc: Marland McalpineSheikh A. Ellsworth Lennoxejan-Sie, MD, <Dictator> Charlesetta GaribaldiSHEIKH A TEJAN-SIE MD ELECTRONICALLY SIGNED 12/18/2012 14:10

## 2014-11-19 NOTE — H&P (Signed)
PATIENT NAME:  Bobby [name], Bobby [name] MR#:  [id number] DATE OF BIRTH:  1971/07/04  DATE OF ADMISSION:  01/14/2013  PRIMARY CARE PHYSICIAN: Bluford MainSheikh Tejan-Sie, MD   CHIEF COMPLAINT: Nausea, vomiting, abdominal pain and elevated sugars.   HISTORY OF PRESENT ILLNESS: Bobby [name] is a 44 year old Caucasian gentleman with a past medical history of type 1 diabetes, peripheral diabetic neuropathy, history of suspected diabetic gastroparesis, chronic abdominal pain and on chronic narcotics, comes to the Emergency Room with sugars in the 600s. He has been having nausea and vomiting at home.  The patient claims he vomited about 4 to 5 times today and currently is dry heaving. He has been asking for IV Dilaudid in the Emergency Room. Currently, during my evaluation he appears hemodynamically stable, does not have any nausea or vomiting at this time. He is being admitted for further evaluation and management of his acute renal failure with abdominal pain and uncontrolled type 1 diabetes. The patient was started on IV insulin drip and received IV fluids.   PAST MEDICAL HISTORY: 1.  Type 1 diabetes complicated with diabetic peripheral neuropathy and possible gastroparesis.  2.  Hypertension.  3.  Chronic abdominal pain with history of chronic narcotic use.  4.  Recurrent hospitalizations.  This is his fourth admission in a month and a half.  5.  Peptic ulcer disease.  6.  Migraine headaches.  7.  Hypertension.  8.  Anxiety.   PAST SURGICAL HISTORY:  1.  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  2.  History of gastric bypass surgery.  3.  History of surgery for volvulus. [place] in the last 2 years.  4.  History of anxiety.  5.  Multiple other abdominal surgeries including J-tube for feeding.   ALLERGIES: MORPHINE, TYLENOL, NSAIDS and CORICIDIN.   SOCIAL HISTORY: Denies any alcohol or tobacco use. He is on Disability, lives with his boyfriend.   FAMILY HISTORY: Both dad and  mom had diabetes.   OUTPATIENT MEDICATIONS:  1.  Ambien 10 mg at bedtime.  2.  Amlodipine 5 mg daily.  3.  Calcium with vitamin D, 2 tablets b.i.d.  4.  Diovan 80 mg daily.  5.  Fentanyl 75 mcg 1 patch transdermal every 48 hours.  6.  Diovan 80 mg p.o. daily.  7.  Gabapentin 800 mg 3 times a day.  8.  Insulin Lantus 12 to 15 units according to sliding scale once a day.  9.  Insulin lispro 3 units 3 times a day per sliding scale.  10.  Metoclopramide 5 mg 1 tablet 3 times a day as needed.  11.  Multivitamin p.o. daily.  12.  Zofran ODT as needed.  13.  Oxycodone 10 mg every 4 hours as needed.  14.  Fentanyl patch 75 mcg 1 patch transdermal 48 hourly.  15.  Thiamine 100 mg daily.  16.  Promethazine 25 mg 1 every 8 hours as needed.  17.  Vitamin B12 injections 1000 mcg every month.  18.  Vitamin C 1000 mg daily.  19.  Vitamin D2 50,000 units 1 capsule 3 times a week on Monday, Wednesday and Friday.  20.  Zenpep 25000  /136,000/80,000 units 3 tablets 3 times a day with meals.   REVIEW OF SYSTEMS:  CONSTITUTIONAL: No fever. Positive for fatigue, weakness.  EYES: No blurred or double vision or glaucoma.  ENT: No tinnitus, ear pain, hearing loss.  RESPIRATORY: No cough, wheeze, hemoptysis or dyspnea.  CARDIOVASCULAR: No chest pain, orthopnea, edema  or arrhythmia.  GASTROINTESTINAL: Positive for nausea and vomiting and chronic abdominal pain, no GERD.  GENITOURINARY: No dysuria or hematuria.  ENDOCRINE: No polyuria, nocturia or thyroid problems.  NEUROLOGICAL:  No CVA, TIA, seizures or dementia.  PSYCHIATRIC: No anxiety, depression, or bipolar disorder. All other systems reviewed and negative.   PHYSICAL EXAMINATION: GENERAL: The patient is awake, alert, oriented x 3, not in acute distress.  VITAL SIGNS: He is afebrile, pulse is 82, blood pressure is 166/99%, sats are 98% on room air.  HEENT: Atraumatic, normocephalic. PERRLA. EOMI intact. Oral mucosa is moist.  NECK: Supple. No JVD.  No carotid bruit.  RESPIRATORY: Clear to auscultation bilaterally. No rales, rhonchi, respiratory distress or labored breathing.  CARDIOVASCULAR: Both the heart sounds are normal. Rate, rhythm is regular. PMI not lateralized. Chest is nontender.  EXTREMITIES: Good pedal pulses, good femoral pulses. No lower extremity edema.  ABDOMEN: Soft, benign.  The patient has generalized diffuse tenderness. No organomegaly. Positive bowel sounds.  NEUROLOGIC: Grossly intact cranial nerves II through XII. No motor or sensory deficits.  PSYCHIATRIC: The patient is awake, alert, oriented x 3.   LABORATORY DATA: UA negative for UTI. Lipase is 21. Troponin is 0.02.  Beta hydroxybutyrate  is 0.80.  Glucose is 656, BUN is 22, creatinine is 1.9, sodium is 130, potassium is 3.9, chloride is 100, bicarb is 21, calcium is 8.2. H and H is 10.4 and 32.1, platelet count is 270, pH is 7.30.   ASSESSMENT AND PLAN: Bobby [name] is a 44 year old with past medical history of type 1 diabetes, status post total pancreatectomy, history of hypertension, comes in with:   1. Uncontrolled type 1 diabetes:  Sugars were up in the 600s. The patient was here in the ER 2 days ago. This is the patient's fourth admission in the last 1-1/2 months. CT of abdomen June 16th showed nothing acute other than constipation and some chronic changes secondary to surgeries in the past. The patient has been started on IV fluids. He is going to be admitted in the CCU with an insulin drip, non-DKA.  The patient has been following up with [facility]; however, he has not been able to keep up with the appointment, hence, he is agreeable to see Endocrinology here in [place].  We will ask Dr. Tedd Sias, who knows the patient from previous admission, to see him in consultation and hopefully follow up as outpatient.   We will keep n.p.o. except meds and ice chips.  2. Nausea, vomiting, dehydration with renal failure:  Secondary to elevated sugars and poor p.o.  intake. Zofran and Phenergan p.r.n.  3. Chronic abdominal pain with narcotic dependence:  He has been asking for IV Dilaudid. We will give IV Dilaudid until he is able to resume his p.o. oxycodone. Continue fentanyl patch.  4. Diabetic gastroparesis:  Reglan p.o. p.r.n.  5. Hypertension:  Continue Diovan once creatinine is stable.  6. We will continue the rest of his home meds.  7. Deep vein thrombosis prophylaxis:  Subcutaneous heparin.   CODE STATUS:  FULL CODE.    Further workup according to the patient's clinical course.   TIME SPENT: 55 minutes.   ____________________________ Wylie Hail Allena Katz, MD sap:cb D: 01/14/2013 16:27:47 ET T: 01/14/2013 16:56:39 ET JOB#: [id number]  cc: Kabrina Christiano A. Allena Katz, MD, <Dictator> Sheikh A. Ellsworth Lennox, MD Willow Ora MD ELECTRONICALLY SIGNED 01/21/2013 14:34

## 2014-11-19 NOTE — Consult Note (Signed)
PATIENT NAME:  Bobby [name], POOLER MR#:  [id number] DATE OF BIRTH:  Aug 07, 1970  DATE OF CONSULTATION:  04/11/2013  REFERRING PHYSICIAN:  Alfredia Ferguson A. Elijio Miles, MD CONSULTING PHYSICIAN:  A. Lavone Orn, MD  CHIEF COMPLAINT:  Uncontrolled diabetes.   HISTORY OF PRESENT ILLNESS:  Bobby [name] is a 44 year old male, well known to me with long-standing uncontrolled type 1 diabetes, who was admitted with complaints of weakness, chronic abdominal pain, nausea, and diarrhea. He was hospitalized from August 25 to August 29 at [facility] where he was found to have a mycobacterial sepsis. Per patient, he had had an abnormal chest x-ray, which was concerning for aspiration pneumonia, and he had had positive blood cultures for mycobacteria. He had a PICC line placed and was started on IV antibiotics. He was told he needs to continue to use antibiotics for eight weeks. After discharge, several days later, he started feeling poorly again. He went back to [facility] and says he was in the observation unit for about 24 hours where he received fluids. He has been home for about a week and had initially felt well, but then started feeling poorly again. Today, he has no new acute complaints. His diabetes regimen at home has been Lantus 5 units at night and 7 units in the morning as well as Humalog 6 units t.i.d. before meals and a sliding scale of 1 unit of Humalog for every 50 that blood sugar is over 150. Since hospitalization, he has been on a regimen of Lantus 10 units q. 24 hours (initial dose given 9/12 at 8:00 p.m. and second dose given 9/13 at 12:00 p.m.), and NovoLog insulin sliding scale 2 units per 50 over a target of 150. Sugars have been in the 151 to 314 range in the last 24 hours. He did receive 2 units NovoLog at breakfast when his sugar was 167. Appetite is fairly good. He typically wants a regular diet during his hospitalizations with increased protein if possible.   The cause of diabetes is prior pancreatectomy.  Pancreatectomy indication was recurrent hypoglycemia after a gastric bypass surgery. He has not had outpatient Endocrinology followup in quite some time, perhaps six months or more. He had a scheduled appointment with me last month which he did not keep. He was to see Dr. Frederik Pear, [facility], earlier this month, however, claims that that appointment got canceled without his knowledge. He has an appointment to see her on June 18, 2013. He does report he did see her during the hospitalization last month while at [facility]. States no changes in his insulin regimen were made at that time.   PAST MEDICAL HISTORY: 1.  Type 1 diabetes.  2.  Diabetic peripheral neuropathy.  3.  Recurrent diabetic ketoacidosis.  4.  Hypoglycemia unawareness. 5.  Hypertension.  6.  Vitamin D deficiency.  7.  Peptic ulcer disease.  8.  Pancreatic exocrine insufficiency.  9.  Chronic abdominal pain.  10.  Migraine headaches.  11.  Hiatal hernia. 12.  Mycobacterial sepsis, August 2014, hospitalization at [facility].  13.  Renal insufficiency.   PAST SURGICAL HISTORY:  1.  Gastric bypass, July 2004.  2.  Partial pancreatectomy, May 2008.  3.  Completion pancreatectomy, July 2009.  4.  Vagotomy, June 2011.   HOME MEDICATIONS:  1.  Lantus insulin 7 units in morning and 5 units at bedtime.  2.  Humalog 6 units t.i.d. before meals plus a Humalog insulin sliding scale.  3.  Avelox 400 mg daily.  4.  Ambien 10 mg at bedtime p.r.n.  5.  Amlodipine 10 mg daily.  6.  Caltrate Plus Vitamin D 1 tab daily.  7.  Carafate 1 gram 2 tabs t.i.d.  8.  Carvedilol 12.5 mg b.i.d.  9.  Colace 100 mg b.i.d. as needed.  10.  Diovan 80 mg daily.  11.  Fentanyl patch 100 mcg q. 48 hours.  12. Gabapentin 400 mg two caps t.i.d.  13.  MiraLAX 17 grams daily as needed.  14.  Multivitamin once daily.  15.  Zofran 4 mg p.o. q.6 hours as needed.  16.  Oxycodone 10 mg every 4 hours as needed.  17.  Promethazine 25 mg PR every  6 hours as needed.  18.  Vitamin B12 1000 mcg daily.  19.  Vitamin D 50 units Monday, Wednesday, Friday as directed.  20.   Zenpep 25,000/136,000/85,000 3 caps t.i.d. with meals and 1 to 2 caps with snacks.  21.  Azithromycin 250 mg IV daily.   SOCIAL HISTORY:  The patient lives with his boyfriend here in [place]. Denies use of tobacco and alcohol. On disability.   FAMILY HISTORY:  Positive for diabetes.   ALLERGIES:  MORPHINE, TYLENOL, NSAIDS, CORICIDIN.   REVIEW OF SYSTEMS:  GENERAL:  No weight loss. He does report recent fevers and chills for the last weeks, max temperature 101.  HEENT:  Denies blurred vision. Denies sore throat.  NECK:  Denies neck pain.  CARDIAC:  Denies chest pain. Denies palpitations.  ABDOMEN:  Reports abdominal pain. Reports nausea and vomiting, reports loose stools.  ENDOCRINE:  Denies heat or cold intolerance. Denies polyuria.  EXTREMITIES:  Denies leg swelling. Denies focal weakness.  SKIN:  Denies rash or recent skin changes.  LYMPHATIC AND HEMATOLOGIC:  Denies easy bruisability or recent bleeding.   PHYSICAL EXAMINATION:  VITAL SIGNS:  Height 70.9 inches, weight 155 pounds, BMI 21.6, temperature 97.6, pulse 58, respirations 18, blood pressure 113/75, pulse ox 100% on room air.  GENERAL:  Well-developed white male, appears chronically ill, pale.  HEENT:  EOMI.   OROPHARYNX:  Clear.  NECK:  Supple. No thyromegaly.  CARDIAC:  Regular rate and rhythm. No murmur.  PULMONARY:  Clear to auscultation bilaterally. No wheeze.  ABDOMEN:  Mild tenderness, no guarding, no focal tenderness. Positive bowel sounds.  SKIN:  No rash or dermatopathy noted.  MUSCULOSKELETAL:  No edema. No tremor.  NEUROLOGIC:  Nonfocal, no acute deficits.  LYMPHATIC:  No submandibular or anterior cervical adenopathy.  PSYCHIATRIC:  Alert and oriented, calm, cooperative.   LABORATORY DATA:  September 12th:  Glucose 441, BUN 26, creatinine 2.6, sodium 135, potassium 4.9, eGFR 29,  troponin I less than 0.02. HIV negative. Blood cultures no growth to date x 2.   ASSESSMENT:   18. A 44 year old male with multiple medical problems, include recent mycobacterial sepsis with PICC in place and receiving ongoing antibiotics, presenting with several days of lethargy, chills and chronic abdominal pain with new diarrhea.  2. Long-standing uncontrolled diabetes with neurologic complications including diabetic peripheral neuropathy.   RECOMMENDATION:  1.  For diabetes. He has had fair control in recent days. I will continue his outpatient regimen to include Lantus 7 units in the morning and 5 units at night as well as Humalog 6 units t.i.d. and a modified sliding scale.  2.  We will change diet to regular, at his request. No concentrated sweets should be allowed.   Thank you for the kind request for consultation. I will follow  along with you.    ____________________________ A. Lavone Orn, MD ams:jm D: 04/11/2013 12:40:17 ET T: 04/11/2013 15:35:17 ET JOB#: [id number]  cc: A. Lavone Orn, MD, <Dictator> Sherlon Handing MD ELECTRONICALLY SIGNED 04/12/2013 17:31

## 2014-11-19 NOTE — Discharge Summary (Signed)
PATIENT NAME:  Bobby [name], Bobby [name] MR#:  [id number] DATE OF BIRTH:  July 03, 1971  DATE OF ADMISSION:  02/08/2013 DATE OF DISCHARGE:  02/17/2013  ADMITTING PHYSICIAN: Susa GriffinsPadmaja Vasireddy, MD  PRIMARY CARE PHYSICIAN: Silas FloodSheikh A. Ellsworth Lennoxejan-Sie, MD  CONSULTATIONS: Endocrinology, Dr. Tedd SiasSolum.   DISCHARGE DIAGNOSES:  1.  Diabetic ketoacidosis.  2.  Chronic pain syndrome.  3.  Chronic abdominal pain.  4.  Hypertension.  5.  Chronic kidney disease.   DISCHARGE MEDICATIONS: Lantus insulin 10 units q.a.m. and 4 units at bedtime, carvedilol 12.5 mg b.i.d. hydralazine 50 mg b.i.d., amlodipine 10 mg daily.   The patient is to resume other home medications. Please refer to discharge medical reconciliation for details.   PROCEDURES: None, except a CT abdomen and pelvis.   HOSPITAL COURSE: This 44 year old male was admitted through the Emergency Room with clinical features consistent with diabetic ketoacidosis. This was presumably due to noncompliance with medication. Please refer to history and physical for full details. He was admitted to the Intensive Care Unit, placed on IV insulin drip with normalization of his glucose, correction of his anion gap within about 12 hours after admission. He was transitioned to basal plus bolus insulin regimen. Otherwise, the patient'Jennifr Gaeta hospital stay was complicated by markedly labile blood sugars, however, more in the hyperglycemic range. The patient'Vikas Wegmann compliance with diet was suspect. Nurses did find extraneous meals in his room on several occasions. The patient was instructed to comply fully with medications and his diet. The patient'Orrin Yurkovich chronic abdominal pain persisted, for which he has been worked up in the past and been followed by gastroenterology as an outpatient. His blood pressure remained fairly well controlled, although he did have a few spikes of uncontrolled hypertension, which responded to p.r.n. hydralazine. The patient remained fully ambulatory throughout his stay. He did not  have any exacerbations of his comorbidities. His kidney function remained close to his current baseline. The patient is being discharged to home in a satisfactory condition.  DIET: ADA diet, low-sodium.   ACTIVITY: As tolerated.   FOLLOWUP: With Dr. Ellsworth Lennoxejan-Sie in 1 to 2 weeks.   ____________________________ Silas FloodSheikh A. Ellsworth Lennoxejan-Sie, MD sat:jm D: 03/04/2013 13:12:00 ET T: 03/04/2013 14:08:10 ET JOB#: [id number]  cc: Sheikh A. Ellsworth Lennoxejan-Sie, MD, <Dictator> Charlesetta GaribaldiSHEIKH A TEJAN-SIE MD ELECTRONICALLY SIGNED 03/17/2013 13:45

## 2014-11-19 NOTE — Consult Note (Signed)
PATIENT NAME:  VANE, YAPP MR#:  [id number] DATE OF BIRTH:  [birth year]  DATE OF CONSULTATION:  01/19/2013  REFERRING PHYSICIAN:  Pernell Dupre, MD CONSULTING PHYSICIAN:  A. Lavone Orn, MD  CHIEF COMPLAINT: Uncontrolled diabetes.   HISTORY OF PRESENT ILLNESS: This is a 44 year old male with diabetes, insulin requiring, who has been hospitalized since the 18th of June for nausea, vomiting and abdominal pain. The patient has known chronic abdominal pain requiring chronic narcotics. He has had multiple abdominal surgeries. He has diabetes due to prior pancreatectomy. Diabetes is complicated by peripheral neuropathy and concern for autonomic neuropathy with gastroparesis. Blood sugars are chronically uncontrolled. He has had several prior hospitalizations, during which I have been involved with diabetes management. Most recent hospitalization was last month. Discharge insulin regimen included Lantus 10 units each morning, lispro 3 units t.i.d. a.c. plus a lispro sliding scale q.a.c and at bedtime. During this hospitalization, he had been maintained on the regimen of Lantus and lispro with reasonable blood sugar control in the 80-200 range up until yesterday. He had a blood sugar at 8 a.m. of 282, by 11 a.m. it was increased to 383, by 4:30 p.m. it was over 600 and only after repeated boluses of insulin did his sugar improve. The patient attributes worsened blood sugar control yesterday to pain and nausea with vomiting. He states in the days prior his pain had improved considerably and he was able to keep his nausea with emesis at bay. He has described this in his past too, a tendency for higher blood sugars when having pain and vomiting. Since this morning, sugars have been in the range of 120-390. He is tolerating his diet. He is on a 2200 calorie carb-controlled diet with snacks between breakfast and lunch and again between lunch and supper. He denies nausea at this time.   MEDICAL HISTORY:  1.  Insulin-dependent diabetes mellitus.   2. Diabetic neuropathy.  3. Recurrent DKA.  4. Hypoglycemia unawareness.  5. Hypertension.  6. Vitamin D deficiency.  7. Peptic ulcer disease.  8. Pancreatic exocrine insufficiency.  9. Chronic abdominal pain.  10. Migraine headaches.  11. Hiatal hernia.   PAST SURGICAL HISTORY:  1. Gastric bypass, July 2004.  2. Partial pancreatectomy, May 2008.  3. Completion pancreatectomy, July 2009.  4. Vagotomy, June 2011.   CURRENT MEDICATIONS:  1. Lantus 10 units once daily.  2. Lispro 3 units t.i.d. a.c.  3. Regular insulin sliding scale.  4. Amlodipine 5 mg daily.  5. Vitamin C 1000 mg daily.  6. Calcium plus vitamin D 500-200 two tabs b.i.d.  7. Enalapril 5 mg daily.  8. Vitamin D 50,000 units once weekly.  9. Neurontin 800 mg t.i.d.  10. Multivitamin 1 tab daily.  11. Pancrelipase 12,000 units 6 caps t.i.d. with meals.  12. Carafate 1 g q.i.d.  13. Thiamine 1000 mg daily.  14. Heparin 5000 units subcutaneous q.8 hours.  15. Fentanyl patch 75 mcg q.2 days.  16. Normal saline 0.9% at 100 mL/h.   ALLERGIES:  1. CORICIDIN.  2. MORPHINE.  3. TYLENOL.   SOCIAL HISTORY: The patient relocated to [place] from [place] in the last 6 months. He has not yet established care with endocrinology locally. Denies tobacco use.   FAMILY HISTORY: Mother and father both with diabetes.    REVIEW OF SYSTEMS:  GENERAL: No recent weight loss. No fevers.  HEENT: Blurred vision when blood sugars are high, none currently. No sore throat.  NECK: No neck pain. No  dysphagia.  CARDIAC: No chest pain. No palpitation.  PULMONARY: No cough. No shortness of breath.  ABDOMEN: Chronic abdominal pain. No nausea at this time.  EXTREMITIES: Reports mild leg swelling since this morning.  NEUROLOGIC: No recent falls. No headaches.  ENDOCRINE: No heat or cold intolerance.  HEMATOLOGY: No recent bleeding or easy bruisability.   PHYSICAL EXAMINATION:  VITAL SIGNS:  Temp 98.1, pulse 79, respirations 20, blood pressure 146/88, pulse ox 99% on room air.  GENERAL: Chronically ill-appearing white male in no acute distress.  HEENT: EOMI. Oropharynx is clear. Mucous membranes moist.  NECK: Supple. No thyromegaly.  CARDIAC: Regular rate and rhythm. No appreciable murmur. No carotid bruit.  PULMONARY: Clear to auscultation bilaterally. No wheeze.  ABDOMEN: Diffuse nonfocal mild tenderness to palpation. No guarding. No rebound.  EXTREMITIES: Trace ankle edema bilaterally, left worse than right.  SKIN: No acanthosis nigricans. No rash is appreciated. No dermatopathy is present.  PSYCHIATRIC: Alert and oriented x3.   LABORATORY: On 01/18/2013, glucose 420, iron 72, BUN 18, creatinine 1.54, sodium 140, eGFR 56, TIBC 208, iron saturation 35%, calcium 8.6, WBC 6.7, hemoglobin 9.9, hematocrit 29.4%, platelets 249.   ASSESSMENT: A 44 year old male with uncontrolled insulin-dependent diabetes due to prior pancreatectomy. 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 Diabetes currently uncontrolled, specifically with severe hyperglycemia yesterday, now improved.   RECOMMENDATIONS:  1. Continue once daily Lantus. Will shift this to morning dosing starting tomorrow, to be given at 8 a.m.  2. Continue Humalog 3 units t.i.d. a.c.  3. The patient is complaining about his diet, requests regular diet, although intends to keep to low-carb choices within his meal tray, just would like to have more protein choices and more vegetables. I certainly think this is worth a reasonable try. I am going to change his diet to regular. I anticipate he may need more Humalog if he consumes more; we will see.  4. DC regular insulin sliding scale. Change to Humalog insulin scale, starting with 1 unit per every 50 over a target of 150, increasing to 2 units per every blood sugar of 50/201.   Thank you for the kind request for consultation. I will follow along with you.    ____________________________ A. Lavone Orn, MD ams:gb D: 01/19/2013 21:00:21 ET T: 01/19/2013 21:20:46 ET JOB#: [id number]  cc: A. Lavone Orn, MD, <Dictator> Sherlon Handing MD ELECTRONICALLY SIGNED 01/22/2013 8:23

## 2014-11-19 NOTE — Consult Note (Signed)
Allergies:  Coricidin: Swelling  Morphine: Hives, Itching  Tylenol: Other  Non-Steroidal Anti-Inflammatory Drugs: Do NOT Use  Assessment/Plan:  Assessment/Plan Patient was seen and examined. Uncontrolled type 1 diabetes in a young male with a complicated medical hx which includes a total pancreatectomy. Out pt diabetes regimen was Lantus 15 units qAM and Humalog 8 units tid AC + SSI. In last 24 hours, sugars have ranged 233-393 and he's received a total of Humalog 22 units.  A/ Uncontrolled type 1 diabetes  P/ Lantus 5 units one time tonight Lantus 12 units tomorrow morning and then likely 15 units qAM thereafter Humalog 6 units tid AC Humalog SSI of 1 unit per 50 over a target of 150 Out-pt f/u with [facility]. Will try and get something scheduled before DC as he's not been seen there in over 3 years.  Full consult will be dictated.   Electronic Signatures: Raj JanusSolum, Anna M (MD)  (Signed 27-May-14 17:03)  AuthoredForrestine Him: ALLERGIES, Assessment/Plan   Last Updated: 27-May-14 17:03 by Raj JanusSolum, Anna M (MD)

## 2014-11-19 NOTE — Discharge Summary (Signed)
PATIENT NAME:  Bobby [name], Bobby [name] MR#:  [id number] DATE OF BIRTH:  1970/09/23  DATE OF ADMISSION:  02/27/2013  DATE OF DISCHARGE:  03/04/2013  PRIMARY CARE PHYSICIAN:  Dr. Ellsworth Lennox  NEPHROLOGIST:  Dr. Cherylann Ratel  DISCHARGE DIAGNOSES: 1.  Acute renal failure.  2.  Severe sepsis.  3.  Community-acquired pneumonia.  4.  Diabetes mellitus with hypoglycemic episodes.  5.  Nausea/vomiting with gastroparesis.  6.  Dehydration.  7.  Hypertension.  8.  Chronic abdominal pain.  9.  Peripheral neuropathy.  10.  Pancreatic insufficiency.   IMAGING STUDIES:  Include a CT scan of the abdomen and pelvis, which showed chronic changes from surgery. Bilateral basilar pneumonia.   CONSULTANTS:  Dr. Tedd Sias of Endocrinology and Dr.  Cherylann Ratel of Nephrology.   ADMITTING HISTORY AND PHYSICAL: Please see detailed H and P dictated by Dr. Renae Gloss. In brief, a 44 year old patient with multiple hospitalizations previously, presented to the hospital with nausea/vomiting secondary to gastroparesis. The patient was admitted to the hospitalist service after he was found to have acute renal failure over CKD.  HOSPITAL COURSE:   1.  Severe sepsis with pneumonia. The patient had febrile episodes on day 1, with leukocytosis, after which a CT scan of the abdomen was done for a source of infection, showed a lateral basal pneumonia. The patient was not short of breath, but secondary to having severe sepsis with end- organ damage, leukocytosis and fever, he was started on Levaquin, to which he responded well, with white count trending down. The patient did not have any fevers. This will be continued for 6 more days at the time of discharge.   2.  Nausea/vomiting, with gastroparesis. This has come back to baseline. The patient does have chronic nausea, but does not have vomiting. Is tolerating his food well.   3.  Acute renal failure. The patient was seen by Dr. Cherylann Ratel of Nephrology. This has come back to baseline with IV fluids,  contributed by dehydration and sepsis.   4. Diabetes mellitus, with hypoglycemia. The patient was started on diabetic diet in the hospital, although his home dose of insulin was continued. The patient counts his carbs, but does not follow a strict carbohydrate diet. This caused hypoglycemia. Dr. Tedd Sias of Endocrinology saw the patient, suggested he be continued on a regular diet, and continue his insulin dosing. The patient did not have any further hypoglycemic episodes.   PHYSICAL EXAMINATION: Today on examination, the patient does have some mild abdominal tenderness, which is chronic. Bowel sounds are present.   DISCHARGE MEDICATIONS INCLUDE: 1.  Ambien 10 mg oral once a day at bedtime.  2.  Creon pancreatic enzymes, 3 capsules oral 3 times a day with meals.  3.  Oxycodone 10 mg oral every 4 hours as needed for pain.  4.  Lantus 10 units subcutaneous once a day in the morning.  5.  Coreg 12.5 mg oral 2 times a day.  6.  Hydralazine 50 mg oral 2 times a day.  7.  Amlodipine 10 mg oral once a day.  8.  Fentanyl 100 mcg patch every 2 days.  9.  Gabapentin 400 mg 2 capsules oral 3 times a day.  10.  Multivitamin 1 tablet oral once a day.  11.  Calcium/vitamin D 1 tablet oral 2 times a day.  12.  Vitamin D3, 50,000 international units oral Monday, Wednesday, Friday.  13.  Carafate 1 gram 2 tablets oral 3 times a day. 14.  Zofran 8 mg oral every 8  hours as needed for nausea/vomiting.  15.  Promethazine 25 mg rectal suppository every 6 hours as needed.  16.  Vitamin B12, 1000 mcg oral once a day.  17.  Colace 100 mg oral 2 times a day.  18.  MiraLAX 17 grams oral once a day as needed for constipation.  19.  Humalog 6 units subcutaneous 3 times a day with meals.  20.  Insulin glargine 4 units at bedtime and 10 units in the morning.  21.  Levaquin 500 mg oral once a day for 6 days.  22.  Diovan 80 mg oral once a day in the morning.   DISCHARGE INSTRUCTIONS:  The patient will be on a low-sodium,  carbohydrate-controlled diet. Activity as tolerated. Follow up with Dr. Cherylann RatelLateef on 03/10/2013.   Time spent on day of discharge in discharge activity was 40 minutes.     ____________________________ Molinda BailiffSrikar R. Rosezetta Balderston, MD srs:mr D: 03/04/2013 13:47:00 ET T: 03/04/2013 20:36:44 ET JOB#: [id number]  cc: Wardell HeathSrikar R. Shaleigh Laubscher, MD, <Dictator> Sheikh A. Ellsworth Lennoxejan-Sie, MD Munsoor Lizabeth LeydenN. Lateef, MD   Orie FishermanSRIKAR R Finbar Nippert MD ELECTRONICALLY SIGNED 03/16/2013 8:39

## 2014-11-19 NOTE — Consult Note (Signed)
Chief Complaint and History:  Referring Physician Dr. Ellsworth Lennoxejan Sie   Chief Complaint Uncontrolled diabetes   Allergies:  Coricidin: Swelling  Morphine: Hives, Itching  Tylenol: Other  Non-Steroidal Anti-Inflammatory Drugs: Do NOT Use  Assessment/Plan:  Assessment/Plan 44 yo M admitted after taking excessive amounts of insulin, Ambien, and Tylenol. Pt stattes he was tired and in pain and this was not a suicidal attempt. Mother at bedside during interview. Pt examined and chart reviewed. initially with low BG into 20s, treated with IV dextrose for a time and that was stopped. Now on insulin SSI alone and sugars into the 300s. he continues to receive NAC in D5 at a rate of 66 units/hr. Tolerating diet. No N/V.  A/ Uncontrolled type 1 diabetes  P/ -give Levemir 7 units now -give Levemir qAM 7 units starting tomorrow -give Levemir 5 units qhs -Give NovoLog 6 units tid AC + modified SSI (1 unit per 60 over target of 150) -Reg diet is ok   Electronic Signatures: Raj JanusSolum, Boneta Standre M (MD)  (Signed 27-Oct-14 13:52)  Authored: Chief Complaint and History, ALLERGIES, Assessment/Plan   Last Updated: 27-Oct-14 13:52 by Raj JanusSolum, Miosha Behe M (MD)

## 2014-11-19 NOTE — Consult Note (Signed)
Chief Complaint:  Subjective/Chief Complaint feeling perhaps a litle better, tolerating solids.  cntinues with abdominal pain in the epigastric area, slow improvement.   VITAL SIGNS/ANCILLARY NOTES: **Vital Signs.:   24-Jun-14 14:40  Vital Signs Type Routine  Temperature Temperature (F) 97.9  Celsius 36.6  Temperature Source oral  Pulse Pulse 81  Respirations Respirations 20  Systolic BP Systolic BP 202  Diastolic BP (mmHg) Diastolic BP (mmHg) 99  Mean BP 119  Pulse Ox % Pulse Ox % 99  Oxygen Delivery Room Air/ 21 %  *Intake and Output.:   24-Jun-14 18:11  Stool  3 bowel movements per patient .   Brief Assessment:  Cardiac Regular   Respiratory clear BS   Gastrointestinal details normal Soft  Nondistended  No masses palpable  Bowel sounds normal  No rebound tenderness  mild tenderness epigastric area   Lab Results: Routine Chem:  21-Jun-14 05:34   Osmolality (calc) 289  24-Jun-14 05:13   Glucose, Serum 85  BUN  19  Creatinine (comp)  1.64  Sodium, Serum 143  Potassium, Serum 4.5  Chloride, Serum  108  CO2, Serum 27  Calcium (Total), Serum 8.9  Anion Gap 8  Osmolality (calc) 286  eGFR (African American)  60  eGFR (Non-African American)  52 (eGFR values <78m/min/1.73 m2 may be an indication of chronic kidney disease (CKD). Calculated eGFR is useful in patients with stable renal function. The eGFR calculation will not be reliable in acutely ill patients when serum creatinine is changing rapidly. It is not useful in  patients on dialysis. The eGFR calculation may not be applicable to patients at the low and high extremes of body sizes, pregnant women, and vegetarians.)   Radiology Results: XRay:    24-Jun-14 09:56, Upper GI  Upper GI   REASON FOR EXAM:    epigastric pain, history of total gastrectomy.  COMMENTS:       PROCEDURE: FL  - FL UPPER GI  - Jan 20 2013  9:56AM     RESULT: Indication: Epigastric pain    Comparison: None    Findings:  Single  contrast examination of the esophagus and proximal small bowel was   performed without complication. Total fluoroscopy time was 0.43 minutes.    Examination of the esophagus demonstrated normal esophageal motility.   Normal esophageal morphology without evidence of esophagitis or     ulceration. No esophageal stricture, diverticula, or mass lesion. No   evidence of hiatal hernia. There is spontaneous gastroesophageal reflux.   There is evidence of prior gastrectomy. Contrast traverses the   anastomotic site without delay into thedistal small bowel. There is no   small bowel dilatation.    IMPRESSION:   1. Unremarkable upper GI following prior gastrectomy.    2. Gastroesophageal reflux.    Dictation Site: 1        Verified By: HJennette Banker M.D., MD  [place]    23-Jun-14 08:37, [place]  [place] Survey   REASON FOR EXAM:    abdominal pain  COMMENTS:   Body Site: GB and Fossa, CBD, Head of Pancreas    PROCEDURE: [place] - [place] LIMITED SURVEY  - Jan 19 2013  8:37AM     RESULT: Pancreas could not be visualized. The pancreatic area is secured   by overlying bowel gas. The patient reports previous cholecystectomy. The   liver echotexture appears to be grossly normal. The liver length is   measured at 21.36 cm. There is  no intrahepatic biliary ductal dilation.   There is no ascites. The portal venousflow is normal. Common bile but   diameter is normal at 4.4 mm.    IMPRESSION:  Status post cholecystectomy. Nonvisualization of the   pancreas. Hepatomegaly present.  Dictation Site: 2        Verified By: Sundra Aland, M.D., MD   Assessment/Plan:  Assessment/Plan:  Assessment 1) h/o hyperinsulinemic hypoglycemia, s/p pancreatectomy, total gastrectomy.  UGIS showing reflux.  This is only food material but can still be caustic to structures accompanied by enzymes.  Now on carafate, continue.  no plans for egd at present.  continue pancreatic  enzymes as you are .   Plan as above, following.   Electronic Signatures: Loistine Simas (MD)  (Signed 24-Jun-14 20:19)  Authored: Chief Complaint, VITAL SIGNS/ANCILLARY NOTES, Brief Assessment, Lab Results, Radiology Results, Assessment/Plan   Last Updated: 24-Jun-14 20:19 by Loistine Simas (MD)

## 2014-11-19 NOTE — Discharge Summary (Signed)
PATIENT NAME:  Bobby [name], Bobby [name] MR#:  [id number] DATE OF BIRTH:  October 15, 1970  DATE OF ADMISSION:  01/14/2013 DATE OF DISCHARGE:  01/22/2013  DISCHARGE DIAGNOSES: 1.  Acute on chronic renal failure, dehydration, uncontrolled type 1 diabetes mellitus.  2.  Morbid obesity status post gastrectomy.  3.  Chronic abdominal pain with narcotic dependence.  4.  Constipation   CONSULTATIONS:  1.  Endocrinology, Dr. Tedd SiasSolum.  2.  Nephrology, Dr. Mady HaagensenMunsoor Lateef.   PROCEDURE:  1.  Renal ultrasound.  2.  Ultrasound of abdomen.  3.  Upper GI fluoroscopy.   PRIMARY CARE PHYSICIAN:  Dr. Ellsworth Lennoxejan-Sie.  HOSPITAL COURSE: The patient was admitted on 01/14/2013 complaining of nausea, vomiting, abdominal pain and uncontrolled blood sugars. Please refer to the history and physical for full details. At presentation, his diabetes was uncontrolled with blood sugars of 600. He was clinically dehydrated with elevation of his creatinine from his baseline. He was hydrated with intravenous fluids, initially placed on IV insulin with normalization of his blood sugars and was transitioned to subcutaneous insulin within 24 hours. He continued to request an increased dose of his analgesia. His fentanyl patch was increased to 7500 mcg every 48 hour.  Given his current abdominal pain, a gastroenterology consultation with Dr. Marva PandaSkulskie, who ordered an upper gastrointestinal series, which revealed, in combination with abdominal x-ray revealed severe constipation. Dr. Tedd SiasSolum evaluating for his diabetes, adjusted insulin slightly with adequate control. The patient'Mozes Sagar creatinine started rising to high of 1.9 despite fluids. I consulted nephrology, who diagnosed patient with chronic kidney disease likely due to diabetes and multiple insults. The patient'Doyal Saric urine flow was normal with adequate volume. He is discharged to home in satisfactory condition.   DISCHARGE MEDICATIONS: Please refer to discharge medical reconciliation which has been reviewed by  me.   FOLLOW-UP:  Dr. Ellsworth Lennoxejan-Sie in 1 to 2 weeks, Dr. Tedd SiasSolum 1 to 2 weeks.   DIET: ADA diet.   ACTIVITY: As tolerated.   TIME SPENT ON DISCHARGE:  35 minutes   ____________________________ Silas FloodSheikh A. Ellsworth Lennoxejan-Sie, MD sat:cc D: 01/25/2013 11:32:50 ET T: 01/25/2013 20:05:13 ET JOB#: [id number]  cc: Sheikh A. Ellsworth Lennoxejan-Sie, MD, <Dictator> Charlesetta GaribaldiSHEIKH A TEJAN-SIE MD ELECTRONICALLY SIGNED 01/26/2013 13:52

## 2014-11-19 NOTE — Consult Note (Signed)
Brief Consult Note: Diagnosis: Adjustment disorder with depressed mood.   Patient was seen by consultant.   Consult note dictated.   Recommend further assessment or treatment.   Comments: [name] has no psychiatric past. He overdosed on multiple medications, he claims, unintentionally in the context of multiple stressors. He is no longer suicidal or homicidal. He is able to CFS.  PLAN: 1. The patient no longer meets criteria for IVC. I will terminate proceedings. D/C sitter.  2. The patient is not interested in pharmacotherapy but agrees to psychotherapy.  3. Will follo tomorrow..  Electronic Signatures: Kristine LineaPucilowska, Lequan Dobratz (MD)  (Signed 27-Oct-14 15:56)  Authored: Brief Consult Note   Last Updated: 27-Oct-14 15:56 by Kristine LineaPucilowska, Janell Keeling (MD)

## 2014-11-19 NOTE — Consult Note (Signed)
Chief Complaint and History:  Referring Physician Dr. Allena KatzPatel   Chief Complaint uncontrolled diabetes   Allergies:  Coricidin: Swelling  Morphine: Hives, Itching  Tylenol: Other  Non-Steroidal Anti-Inflammatory Drugs: Do NOT Use  Assessment/Plan:  Assessment/Plan 44 yo M with uncontrolled diabetes seen in consultation. he is well known to me. Underwent pancreatectomy, causing type 1 diabetes. Diabetes is complicated by peripheral neuropathy. Diabetes is uncontrolled. Sugars in last 48 hrs have ranged 31-394. Current regimen is Lantus 5 units qAM and Humalog 6 units tid AC.   A/ Uncontrolled type 1 diabetes Diabetic peripheral neuropathy Chronic abdominal pain Chronic Nausea History of acute renal failure, resolved  P/ Agree with current insulin orders. Will have FSBS checked now as he was given mealtime Humlaog 6 units for breakfast but then told not to eat. I am concerned about this provoking hypoglycemia. He is fairly sensitive to insulin. In past, we had reasonable BG control on Lantus 3-4 units in PM and 8-10 units in AM plus prandial Humalog. He should be counting carbohydrates. Ok to give regular diet, but he needs mealtime coverage for all carb intake - meals and snacks. Will work closely with team and pt to adjust insulin orders, as needed.  He is stable from diabetes standpoint.  Full consult to be dictated.   Case Discussed With patient   Electronic Signatures: Raj JanusSolum, Anna M (MD)  (Signed 04-Aug-14 09:26)  Authored: Chief Complaint and History, ALLERGIES, Assessment/Plan   Last Updated: 04-Aug-14 09:26 by Raj JanusSolum, Anna M (MD)

## 2014-11-19 NOTE — Consult Note (Signed)
Chief Complaint and History:  Referring Physician Dr. Ellsworth Lennoxejan [name]   Chief Complaint uncontrolled diabetes   Allergies:  Coricidin: Swelling  Morphine: Hives, Itching  Tylenol: Other  Non-Steroidal Anti-Inflammatory Drugs: Do NOT Use  Assessment/Plan:  Assessment/Plan 44 yo [name] with chronically uncontrolled diabetes admitted earlier today for hyperglycemia without ketosis from the ER. BG was >500 and he had no ketosis. He was treated with multiple inj of SQ insulin and sugar has improved. Pt was intereviewed and examined.  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 Diet order changed.  Will follow along with you.   Electronic Signatures: Bobby [name], Bobby [name] (MD)  (Signed 01-Jul-14 17:26)  Authored: Chief Complaint and History, ALLERGIES, Assessment/Plan   Last Updated: 01-Jul-14 17:26 by Bobby [name], Bobby [name] (MD)

## 2014-11-19 NOTE — Consult Note (Signed)
Chief Complaint:  Subjective/Chief Complaint seen for n/v abdominal pain in the setting of h/o total gastrectomy, total pancreatectomy and CCY.  Feeling better than yesterday.   Eating some.  positive bm, no n/v.   VITAL SIGNS/ANCILLARY NOTES: **Vital Signs.:   23-Jun-14 14:00  Vital Signs Type Routine  Temperature Temperature (F) 98.1  Celsius 36.7  Temperature Source oral  Pulse Pulse 79  Respirations Respirations 20  Systolic BP Systolic BP 024  Diastolic BP (mmHg) Diastolic BP (mmHg) 89  Mean BP 113  Pulse Ox % Pulse Ox % 99  Pulse Ox Activity Level  At rest  Oxygen Delivery Room Air/ 21 %   Brief Assessment:  Cardiac Regular   Respiratory clear BS   Gastrointestinal details normal Soft  Nondistended  Bowel sounds normal  No rebound tenderness  tender to palpation in the epigastrum   Lab Results: Routine Chem:  22-Jun-14 14:20   Glucose, Serum  420  BUN 18  Creatinine (comp)  1.54  Sodium, Serum 140  Potassium, Serum 4.3  Chloride, Serum 105  CO2, Serum 27  Calcium (Total), Serum 8.6  Anion Gap 8  Osmolality (calc) 299  eGFR (African American) >60  eGFR (Non-African American)  56 (eGFR values <36m/min/1.73 m2 may be an indication of chronic kidney disease (CKD). Calculated eGFR is useful in patients with stable renal function. The eGFR calculation will not be reliable in acutely ill patients when serum creatinine is changing rapidly. It is not useful in  patients on dialysis. The eGFR calculation may not be applicable to patients at the low and high extremes of body sizes, pregnant women, and vegetarians.)  Iron Binding Capacity (TIBC)  208  Unbound Iron Binding Capacity 136  Iron, Serum 72  Iron Saturation 35 (Result(s) reported on 18 Jan 2013 at 03:28PM.)  Routine Hem:  22-Jun-14 14:20   WBC (CBC) 6.7  RBC (CBC)  3.49  Hemoglobin (CBC)  9.9  Hematocrit (CBC)  29.4  Platelet Count (CBC) 249  MCV 84  MCH 28.4  MCHC 33.8  RDW  16.0  Neutrophil %  61.5  Lymphocyte % 30.0  Monocyte % 5.3  Eosinophil % 1.9  Basophil % 1.3  Neutrophil # 4.1  Lymphocyte # 2.0  Monocyte # 0.4  Eosinophil # 0.1  Basophil # 0.1 (Result(s) reported on 18 Jan 2013 at 02:52PM.)   Radiology Results: [place]    23-Jun-14 08:37, [place]  [place]   REASON FOR EXAM:    abdominal pain  COMMENTS:   Body Site: GB and Fossa, CBD, Head of Pancreas    PROCEDURE: [place] - [place] SURVEY  - Jan 19 2013  8:37AM     RESULT: Pancreas could not be visualized. The pancreatic area is secured   by overlying bowel gas. The patient reports previous cholecystectomy. The   liver echotexture appears to be grossly normal. The liver length is   measured at 21.36 cm. There is no intrahepatic biliary ductal dilation.   There is no ascites. The portal venousflow is normal. Common bile but   diameter is normal at 4.4 mm.    IMPRESSION:  Status post cholecystectomy. Nonvisualization of the   pancreas. Hepatomegaly present.  Dictation Site: 2        Verified By: GSundra Aland M.D., MD   Assessment/Plan:  Assessment/Plan:  Assessment 1) abdominal pain in the setting of total pancreatectomy for hyperinsulinemic hypoglycemia, total gastrectomy, CCY. patient with poorly controlled blood glucose  levels intermittantly. likely related to his metabolic status, though other possibilities include esophagitis, anastomotic ulcer, other enteritis. Patietn also with chronic pain syndrome.   Plan 1) continue pancreatic enzymes 2) add carafate one gram tab qid.   3) will get UGIS tomorrow.   Electronic Signatures: Loistine Simas (MD)  (Signed 23-Jun-14 19:38)  Authored: Chief Complaint, VITAL SIGNS/ANCILLARY NOTES, Brief Assessment, Lab Results, Radiology Results, Assessment/Plan   Last Updated: 23-Jun-14 19:38 by Loistine Simas (MD)

## 2014-11-19 NOTE — Consult Note (Signed)
Chief Complaint:  Subjective/Chief Complaint seen for abdominal pain and n/v.  slow improvement, tolerating po.   VITAL SIGNS/ANCILLARY NOTES: **Vital Signs.:   26-Jun-14 14:20  Vital Signs Type Routine  Temperature Temperature (F) 98.4  Celsius 36.8  Temperature Source oral  Respirations Respirations 18  Systolic BP Systolic BP 062  Diastolic BP (mmHg) Diastolic BP (mmHg) 99  Mean BP 118  Pulse Ox % Pulse Ox % 97  Pulse Ox Activity Level  At rest  Oxygen Delivery Room Air/ 21 %   Brief Assessment:  Cardiac Regular   Respiratory clear BS   Gastrointestinal details normal Soft  Nondistended  No rebound tenderness  mild epigastric tenderness to palpation.   Lab Results: Routine Chem:  26-Jun-14 03:44   Glucose, Serum  206  BUN  19  Creatinine (comp)  1.60  Sodium, Serum 142  Potassium, Serum 3.8  Chloride, Serum  110  CO2, Serum 26  Calcium (Total), Serum  8.3  Anion Gap  6  Osmolality (calc) 291  eGFR (African American) >60  eGFR (Non-African American)  53 (eGFR values <23m/min/1.73 m2 may be an indication of chronic kidney disease (CKD). Calculated eGFR is useful in patients with stable renal function. The eGFR calculation will not be reliable in acutely ill patients when serum creatinine is changing rapidly. It is not useful in  patients on dialysis. The eGFR calculation may not be applicable to patients at the low and high extremes of body sizes, pregnant women, and vegetarians.)  Result Comment labs - This specimen was collected through an   - indwelling catheter or arterial line.  - A minimum of 596m of blood was wasted prior    - to collecting the sample.  Interpret  - results with caution.  Result(s) reported on 22 Jan 2013 at 04:23AM.  Misc Urine Chem:  26-Jun-14 16:19   Creatinine, Urine Random  10.0 (Result(s) reported on 22 Jan 2013 at 04:50PM.)  Sodium, Urine Random 140 (Result(s) reported on 22 Jan 2013 at 04:50PM.)  Routine Hem:  26-Jun-14  03:44   Platelet Count (CBC) 214 (Result(s) reported on 22 Jan 2013 at 04:13AM.)   Assessment/Plan:  Assessment/Plan:  Assessment 1) epigastric abdominal pain in the settign of previous gastric bypass, complicated by hyperinsulinemic hypoglycemia, with subsequent total gastrectomy, pancreatectomy, CCY.  currently slow improvement of abdominal pain, tolerating po, blood glucose still variable but some better.  Now on carafate.  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   Plan 1) continue carafate.  I would like to see Mr TaBagnallinGI fu in 2-3 weeks as outpatient.  continue current pancreatic enzymes.  Will follow at a distance.   Electronic Signatures: SkLoistine SimasMD)  (Signed 26-Jun-14 18:43)  Authored: Chief Complaint, VITAL SIGNS/ANCILLARY NOTES, Brief Assessment, Lab Results, Assessment/Plan   Last Updated: 26-Jun-14 18:43 by SkLoistine SimasMD)

## 2014-11-19 NOTE — Consult Note (Signed)
PATIENT NAME:  Bobby [name], SCOGGIN MR#:  [id number] DATE OF BIRTH:  1971-04-29  DATE OF CONSULTATION:  12/23/2012  REFERRING PHYSICIAN:  Pernell Dupre, MD CONSULTING PHYSICIAN:  A. Lavone Orn, MD  CHIEF COMPLAINT: Uncontrolled diabetes.   HISTORY OF PRESENT ILLNESS: This is a 44 year old male with a complicated medical history, which includes type 1 diabetes, seen in consultation for uncontrolled diabetes. He was admitted on May 25 for acute renal failure and abdominal pain. Blood sugars were quite elevated at the time of admission with initial blood sugar of 502, without evidence of ketoacidosis. He has been receiving NovoLog insulin sliding scale in the last 24 hours. Blood sugars over the last 25 hours have ranged 233 to 393 and he is received in total 22 units of Humalog. His outpatient diabetes regimen includes Lantus 15 units each morning and Humalog 8 units at meals plus a sliding scale. He recalls at home, blood sugars typically are in the 200 - 250 range. At this time he has nausea, although reports he is eating a regular diet and he is completing his meal trays.   PAST MEDICAL HISTORY: 1.  Diabetes mellitus type 1.  2.  Diabetic peripheral neuropathy.  3.  Hypertension.  4.  History of chronic abdominal pain.  5.  Recent hospitalization just last week for gastroenteritis and acute renal failure.  6.  Gastroesophageal reflux disease.  7.  Peptic ulcer disease.  8.  Migraine headaches.  9.  Hypertension.  10.  Anxiety.   PAST SURGICAL HISTORY: 1.  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  2.  Partial pancreatectomy May 2008.  3.  Completion pancreatectomy July 2009.  4.  Vagotomy June 2011.  5.  Hiatal hernia repair.  6.  Cholecystectomy.   ALLERGIES: CORICIDIN, MORPHINE, TYLENOL.   CURRENT MEDICATIONS: 1.  Humalog insulin sliding scale.  2.  Fentanyl patch 75 mcg q. 48 hours.  3.  Oxycodone 10 mg q. 8 hours.  4.  Creon  delayed release 3 capsules t.i.d. with meals.  5.  Thiamine 100 mg daily.  6.  Normal saline 0.9 at 75 mL/hour   SOCIAL HISTORY: The patient rates recently relocated to [facility] from [place] about 6 weeks ago. Denies tobacco or drug use.   FAMILY HISTORY: Positive for diabetes in his mother.   REVIEW OF SYSTEMS:  GENERAL: Denies weight loss, denies fevers.  HEENT: Denies blurred vision. Denies sore throat.  NECK: Denies neck pain. Denies dysphasia.  CARDIAC: Denies chest pain. Denies palpitations.  PULMONARY: Denies cough. Denies shortness of breath.  ABDOMEN: He has chronic abdominal pain. He has had intermittent diarrhea. He has had chronic nausea.  GENITOURINARY: Denies dysuria or hematuria.  HEMATOLOGIC: Denies easy bruisability or recent bleeding.  ENDOCRINE:  Denies heat or cold intolerance.  SKIN: Denies rash or pruritus.  MUSCULOSKELETAL: Denies leg edema or arthralgias.   PHYSICAL EXAMINATION: VITAL SIGNS: Height 70.9 inches, weight 155 pounds, BMI 21.6, temperature 98.8, pulse 45 - 82, respirations 18, blood pressure 172/91, pulse oximetry 98% on room air.  GENERAL: Chronically ill-appearing white male in no acute distress.  HEENT: Extraocular movements are intact.  Oropharynx is clear.  NECK: Supple. Mild thyromegaly is present. No neck tenderness.  LYMPH: No submandibular or anterior cervical lymphadenopathy.  CARDIAC: Regular rate and rhythm. No audible murmur.  PULMONARY: Clear bilaterally. No wheeze.  ABDOMEN: Soft, nontender, nondistended.  EXTREMITIES: No edema is present. No focal joint swelling.  PSYCHIATRIC: Alert and oriented  x3, calm and cooperative.  SKIN: No rash or appreciable dermatopathy.   LABORATORY DATA: Glucose 366, BUN 16, creatinine 1.2, sodium 140, potassium 4.6, chloride 106, EGFR greater than 60, calcium 8.1, hematocrit 29.8%, WBC 4.4 and platelets 192.   ASSESSMENT: A 44 year old male with insulin-dependent diabetes due to previous  complete pancreatectomy, currently with uncontrolled diabetes. He has had good insulin sensitivity as evidenced by episodes of hypoglycemia in the past; therefore, he must avoid potentially getting high doses of insulin. He would benefit from resumption of a basal/bolus insulin regimen.   RECOMMENDATIONS: 1.  We will give small dose of Lantus tonight, just 5 units.  2.  Start morning insulin tomorrow. Morning insulin is given, rather than evening, due to risk of nocturnal hypoglycemia. We will give 12 units tomorrow anticipate in the days following that we will bump this up to 15 units every day.   3.  Add a standing dose of Humalog 6 units three times  a day before meals.  4.  Adjust sliding scale to Humalog and change to 1 unit per 50 over a target of 150.  5.  The patient does have plans to set up with [facility] endocrinology. He had been seen there in the past, although it in over 3 years. We will assist with getting him an outpatient follow-up at [facility] prior to discharge if possible.    I will follow along with you. Thank you for the kind request for consultation.   ____________________________ A. Lavone Orn, MD ams:cc D: 12/23/2012 17:11:09 ET T: 12/23/2012 17:38:47 ET JOB#: [id number]  cc: A. Lavone Orn, MD, <Dictator>  Clarita Leber, M.D. at [facility] Endocrinology A. Lavone Orn MD ELECTRONICALLY SIGNED 12/29/2012 16:55

## 2014-11-19 NOTE — H&P (Signed)
PATIENT NAME:  Bobby [name], Maitland W MR#:  [id number] DATE OF BIRTH:  1971/04/23  DATE OF ADMISSION:  12/21/2012  REFERRING PHYSICIAN:    PRIMARY CARE PHYSICIAN:  Dr. Ellsworth Lennoxejan-Sie at [facility]  CHIEF COMPLAINT:  Abdominal pain, nausea, vomiting.   HISTORY OF PRESENT ILLNESS: The patient a 44 year old Caucasian male with multiple comorbidities, including history of pancreatectomy for nesidioblastosis, migraines, chronic pain, diabetes, hypertension, peripheral neuropathy, who presents for nausea, vomiting, p.o. intolerance, abdominal pain. Of note, patient was admitted on the 16th and discharged on the 18th of this month for same. The patient states that he initially felt good, but had recurrent symptoms in the last several days, has had nausea, vomiting. In the last 2 days had vomiting up to 10 times and cannot keep food down. He feels dehydrated. The abdominal pain is chronic and has not increased or changed in modality, but his sugars have been noted to be high as an outpatient again, and of note, his creatinine is 2.43, whereas on the 18th, the day of discharge, they had been normalized. Hospitalist services were contacted for further evaluation and management.   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  History of dehydration, anxiety, diabetes, peripheral neuropathy, Barrett's esophagus, GERD, gastric ulcers, history of seizures, hiatal hernia, history of migraines, hypertension, multiple abdominal surgeries, history of gastric bypass and cholecystectomy.   ALLERGIES:  CORICIDIN, MORPHINE, TYLENOL.   SOCIAL HISTORY:  No tobacco, alcohol or drug use. On disability.   FAMILY HISTORY:  Dad and mom both with diabetes.   OUTPATIENT MEDICATIONS:  Ambien 10 mg p.r.n. at bedtime, amlodipine 5 mg daily, calcium with D 2 tabs 2 times a day, Diovan 80 mg daily, fentanyl patch 75 mcg every 48 hours, gabapentin 800 mg 3 times a day, Humalog 10 units 3 times a  day with meals, Lantus 15 units at bedtime, multivitamin 1 tab daily, oxycodone 10 mg q. 4 hours as needed for pain, thiamine 100 mg daily, vitamin B12, 1000 mcg injectable once a month, vitamin C 1000 mg daily, vitamin D2, 50,000 international units 1 cap 3 times a day, Zenpep 3 caps 3 times a day with meals and 1 to 2 caps with snacks.   REVIEW OF SYSTEMS:  CONSTITUTIONAL:  Occasional fevers, fatigue. No weight changes.  EYES:  No blurry vision or double vision.  EARS, NOSE, THROAT:  No tinnitus or hearing loss.  RESPIRATORY:  No cough, wheezing, shortness of breath, hemoptysis.  CARDIOVASCULAR:  Denies chest pain, orthopnea. Has high blood pressure. No edema. Has history of tachycardia.  GASTROINTESTINAL: Positive for chronic abdominal pain and diarrhea. No change in the modality of abdominal pain. Has had nausea, vomiting and p.o. intolerance. History of multiple GI surgeries and pancreatectomy.  GENITOURINARY:  Denies dysuria, hematuria, but has decreased urinary frequency.  ENDOCRINE:  No polyuria or nocturia. HEMATOLOGIC/LYMPHATIC: No anemia or easy bruising.  SKIN:  No rashes.  MUSCULOSKELETAL:  Denies arthritis or gout.  NEUROLOGIC:  Denies focal weakness or numbness. Has history of peripheral neuropathy.  PSYCHIATRIC:  Has insomnia.   PHYSICAL EXAMINATION: VITAL SIGNS:  Temperature on arrival 98.5, pulse rate 97, respiratory rate 18, blood pressure 160/106, O2 sat 97% on room air.  GENERAL:  The patient is a well-developed Caucasian male lying in bed, was actually watching videos on his iPad when I entered the room.  HEENT: Normocephalic, atraumatic. Pupils are equal and reactive. Anicteric sclerae. Extraocular muscles intact. Dry mucous membranes.  NECK:  Supple. No  thyroid tenderness. No cervical lymphadenopathy. CARDIOVASCULAR:   S1, S2. Regular rate and rhythm. No murmurs, rubs or gallops.  LUNGS:  Clear to auscultation, without wheezing, rhonchi or rales. ABDOMEN:  Multiple  healed scars and hyperactive bowel sounds. The patient has tenderness to palpation in upper epigastric, without rebound or guarding.  EXTREMITIES:  No significant lower extremity edema.  NEUROLOGIC: Cranial nerves II through XII grossly intact. Strength is 5/5 in all extremities. Sensation intact to light touch.  PSYCHIATRIC:  Awake, alert, oriented x 3. Pleasant, cooperative.  SKIN:  No obvious rashes.   LABS:  Glucose 502, BUN 28, creatinine 2.43, sodium 130, potassium 3.9, chloride 97. LFTs within normal limits. WBCs 7.3, hemoglobin 11.1, platelets 253. VBG showed pH of 7.33, pCO2 of 37.   ASSESSMENT AND PLAN:  We have a 44 year old male with a history of pancreatectomy, on insulin for diabetes, hypertension, multiple abdominal surgeries, and history of chronic pain, who was just discharged on May 18 for acute on chronic renal failure, hyperglycemia, who presents with same, including p.o. intolerance. Of note, patient was just discharged with possible viral gastroenteritis. It is possible that it is  patient's  etiology of nausea/vomitin  but it is also possible that this is diabetic gastroparesis. In regards to renal failure, I would start the patient on some IV fluids, hold the Diovan, control the blood sugars. I would increase the Lantus, continue the sliding scale insulin, and add Humalog t.i.d. before meals. He appears to be clinically dehydrated as well, and IV fluids should help. We would start him on Zofran for vomiting, and continue his fentanyl patch at this point. Would put him on clears for now, and continue his pancreatic enzyme replacement therapy.   Total time spent is 45 minutes.   The patient is FULL CODE.    ____________________________ Krystal Eaton, MD sa:mr D: 12/21/2012 19:59:00 ET T: 12/21/2012 20:49:29 ET JOB#: [id number]  cc: Krystal Eaton, MD, <Dictator> Sheikh A. Ellsworth Lennox, MD  Krystal Eaton MD ELECTRONICALLY SIGNED 12/31/2012 22:12

## 2014-11-19 NOTE — Consult Note (Signed)
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   Patient was seen by consultant.   Consult note dictated.   Recommend further assessment or treatment.   Orders entered.   Comments: Cont current abx check CT chest today [facility].  Electronic Signatures: Dierdre HarnessFitzgerald, David Patrick (MD)  (Signed 02-Oct-14 13:39)  Authored: Brief Consult Note   Last Updated: 02-Oct-14 13:39 by Dierdre HarnessFitzgerald, David Patrick (MD)

## 2014-11-19 NOTE — Consult Note (Signed)
PATIENT NAME:  Bobby [name], Bobby [name] MR#:  [id number] DATE OF BIRTH:  [birth year]  DATE OF CONSULTATION:  01/27/2013  CONSULTING PHYSICIAN:  A. Lavone Orn, MD  REFERRING PHYSICIAN:  Pernell Dupre, M.D.   CHIEF COMPLAINT: Uncontrolled diabetes.   HISTORY OF PRESENT ILLNESS: This is a 44 year old male with a history of type 1 diabetes, admitted early this morning from the ER with severe hyperglycemia. The patient has chronically uncontrolled diabetes due to prior pancreatectomy. He has a complicated medical history including prior obesity status post gastric bypass. He subsequently developed hypoglycemia attributed to nesidioblastosis and underwent initially a partial pancreatectomy in 2008 followed by a completion pancreatectomy in 2009. He had been out of the state for several years and returned to [place] recently. He has been hospitalized 3 times in the last month for problems with blood sugars and chronic pain and nausea. He is on multiple narcotics and takes pancreatic enzymes for pancreatic exocrine insufficiency. Diabetes is complicated by peripheral neuropathy and there is concern for autonomic neuropathy with gastroparesis.   On presenting to the ER last evening, initial blood sugar was 574, creatinine was elevated at 2.33, EGFR was 39% and he had normal potassium of 4.1. White blood cell count was normal at 6.5 and UA was unremarkable. An anion gap was 13 and bicarbonate was 20. He was given multiple injections of IV insulin and blood sugars gradually improved. This morning, he was started on Lantus once daily as well as a NovoLog insulin sliding scale. The patient reports that on the day of hospitalization, he had woke with an elevated sugar of 324 with nausea and vomiting. Sugars increased into the 400s as the day progressed and he had blurred vision with ongoing nausea with emesis. He reports then that 2 days prior, his sugars had been reasonable, although does not recall what his blood  sugars were and did not bring his glucometer for my review. Two days prior to admission, he had some left groin pain which has since resolved. He denies dysuria or hematuria. He notes no other complaints.   PAST MEDICAL HISTORY: 1. Type 1 diabetes.  2. Diabetic neuropathy.  3. Recurrent DKA.  4. History of hypoglycemia unawareness.  5. Hypertension.  6. Vitamin D deficiency.  7. Peptic  ulcer disease. 8. Pancreatic exocrine insufficiency.  9. Chronic abdominal pain.  10. Migraine headaches.  11. Hiatal hernia.   PAST SURGICAL HISTORY: 1. Gastric bypass July 2004.  2. Partial pancreatectomy, July 2008. 3. Completion pancreatectomy, July 2009.  4. Vagotomy July 2011.   CURRENT INPATIENT MEDICATIONS:  1. Lantus 10 units once daily.  2. NovoLog insulin sliding scale. 3. Amlodipine 10 mg daily.  4. Gabapentin 300 mg t.i.d.  5. Multivitamin 1 tab daily.  6. Pancrelipase 10,500 units 3 caps t.i.d.  7. Thiamine 100 mg daily.  8. Diovan 80 mg daily.  9. Lovenox 40 mg daily.  10. Fentanyl patch 75 mcg q. 3 days.  11. Normal saline 0.9% at 100 mL/hour.   ALLERGIES: CORICIDIN, MORPHINE, TYLENOL.   SOCIAL HISTORY: The patient relocated to [place] from [place] about 6 months ago. Denies tobacco use. He lives with his boyfriend.   FAMILY HISTORY: Mother and father both with history of diabetes.   REVIEW OF SYSTEMS:  No recent weight loss. No fevers. He does think he has had chills for 3 to 4 days.   HEENT: Blurred vision yesterday, now improved. Denies sore throat.   NECK: Denies neck pain, denies dysphasia.  CARDIAC: Denies chest pain. Denies palpitations.   PULMONARY: Denies cough. Denies shortness of breath.   ABDOMEN: Reports chronic abdominal pain, nausea now improved had nausea/vomiting yesterday.   EXTREMITIES:  Denies leg swelling.   GENITOURINARY: Groin discomfort 2 days prior. Denies penile discharge.   GENITOURINARY: Denies dysuria or hematuria.    NEUROLOGIC: No recent falls. Denies headaches.   ENDOCRINE: No heat or cold intolerance.   HEMATOLOGY:  No recent bleeding or easy bruisability.   PHYSICAL EXAMINATION: Height 70.9 inches, weight 150 pounds, BMI 20.9. Temperature 96.8, pulse 88, respirations 20, blood pressure 132/79, pulse oximetry 97% on room air.   GENERAL: Chronically ill-appearing white male in no acute distress.   HEENT: Extraocular movements are intact.   OROPHARYNX: Clear. Mucous membranes moist.   NECK: Supple. No thyromegaly.   CARDIAC: Regular rate and rhythm without murmur. No carotid bruit.   LUNGS: Clear to auscultation bilaterally. No wheeze. No rales, no rhonchi.   ABDOMEN: Diffusely mildly tender, no guarding. No focal tenderness.   EXTREMITIES: No edema is present.   GENITOURINARY: Bilateral distended testes without swelling, normal penis without hypospadia.   SKIN: No rash or dermatopathy is present.   PSYCHIATRIC: Alert and oriented x 3.   ASSESSMENT: 1. Severe hyperglycemia, cause not known, other than questionable compliance with diet and insulin regimen.  2. Chronically uncontrolled diabetes, type I, with neurologic complications.  3. Diabetic neuropathy.  4. Chronic pain.  5. Pancreatic endocrine and exocrine insufficiency status post pancreatectomy.   PLAN: 1. Agree with resuming his once daily Lantus. This is to be taken in the morning. During his last hospitalization, he was having some fasting lows, so Lantus was gradually reduced to 8 units. I plan to give 9 units tomorrow morning and can adjust this if needed.  2. Change prandial insulin to Humalog. Give 3 units t.i.d. a.c.  3. Change sliding scale to Humalog.  Give 1 unit per 50 over a target of 150.  4. Change diet to regular. He really prefers a regular diet and finds this is what he follows at home, so he prefers to do this during his hospitalization as well.   Thank you for the kind request for consultation. I will  follow along with you.   ____________________________ A. Lavone Orn, MD ams:rw D: 01/27/2013 17:37:48 ET T: 01/27/2013 20:01:59 ET JOB#: [id number]  cc: A. Lavone Orn, MD, <Dictator> Sherlon Handing MD ELECTRONICALLY SIGNED 01/29/2013 13:00

## 2014-11-19 NOTE — Consult Note (Signed)
PATIENT NAME:  Bobby [name], Bobby [name] MR#:  [id number] DATE OF BIRTH:  1970-09-29  DATE OF CONSULTATION:  04/30/2013  REFERRING PHYSICIAN:  Bluford MainSheikh Tejan-Sie, MD CONSULTING PHYSICIAN:  Stann Mainlandavid P. Sampson GoonFitzgerald, MD  REASON FOR CONSULTATION: Recurrent fever.   HISTORY OF PRESENT ILLNESS: This is a pleasant 44 year old gentleman who has quite a complicated medical history including severe brittle insulin-dependent diabetes after a pancreatectomy. 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 This was presumed to be due to his port which he had in to receive IV fluids when he became dehydrated. He had the port removed and was treated with Avelox and azithromycin. [facility] and he had a PICC line placed. The patient was discharged on azithromycin and Avelox to be given through the PICC line. He has a lot of issues with chronic diarrhea and malabsorption so these were not given orally. He had a recent admission for worsening diarrhea and we considered stopping the antibiotics in discussions with the [facility] team. However, the patient was hesitant to do this because he is also concerned about some pulmonary lesions that he had [facility] and that may be related to the atypical mycobacterial infection.   The patient was discharged home and returned with DKA. He was treated for this and was about to be discharged home when he developed a high fever to 102. He had also an episode of vomiting he reports and had some coughing associated with it. Currently, he denies any feeling of feverish, any night sweats, any cough, any chest pain. His diarrhea is at its baseline as is his chronic abdominal pain. He has no skin lesions. He has no dysuria, headaches, skin lesions, joint swelling or other infectious symptoms.   PAST MEDICAL HISTORY: 1.  Severe insulin-dependent diabetes status post pancreatectomy.  2.  Chronic dehydration and IV fluid administration.  3.   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  4.  Peripheral diabetes.  5.  Hypertension.  6.  Chronic abdominal pain, on chronic narcotic use.  7.  Mycobacterium mucinogen infection as above.   PAST SURGICAL HISTORY: 1.  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  2.  Gastric bypass surgery.  3.  Surgery for abdominal volvulus.  4.  G-tube placement in the past.  SOCIAL HISTORY: The patient does not smoke, drink or use drugs. He lives with his partner.   ALLERGIES: He is allergic to MORPHINE, TYLENOL, NONSTEROIDALS AND ANTI-INFLAMMATORIES.   FAMILY HISTORY: Both parents have diabetes.   ANTIBIOTICS: Currently he is on azithromycin 250 once a day IV as well as Avelox 400 once a day IV.   OTHER MEDICATIONS: Include Ambien, amlodipine, calcium, carvedilol, Colace, fentanyl, gabapentin, Humalog, insulin, MiraLax, oxycodone, promethazine, sucralfate, vitamin B12, vitamin D3, pancreatic enzymes, Zofran.   REVIEW OF SYSTEMS: Eleven systems reviewed and negative, except as per HPI.  PHYSICAL EXAMINATION: VITAL SIGNS: T-max in last 24 hours was 101.8. I did review his fever curve throughout this hospitalization and he has really had no other fever spikes.  GENERAL: Thin, sitting up. He seems tired, but is in no acute distress.  HEENT: Pupils equal, round and reactive to light and accommodation. Extraocular movements are intact. Sclerae anicteric. Oropharynx is clear.  NECK: Supple.  HEART: Regular.  LUNGS: Some crackles at the left base.  ABDOMEN: Multiple healed scars. He is mildly tender to palpation diffusely. EXTREMITIES: No clubbing, cyanosis or edema.  SKIN: No rash or lesions.  JOINTS: No obvious swelling or erythema.  NEUROLOGIC: Alert and oriented x 3. Grossly nonfocal neuro exam.   LABORATORY AND DIAGNOSTICS: Urinalysis is negative. Blood cultures x 2 from October 1st are negative. White blood count 13.4, hemoglobin 9.3 and platelets 287 from yesterday. Renal function shows a  creatinine of 2.07 with an estimated GFR of 39.   Chest x-ray done last night shows basilar infiltrates. This is compared to a clear x-ray on admission, September 25th. Blood cultures also from September 25th are negative. I took the liberty of ordering a CAT scan of his chest, which I reviewed myself. The interpretation says patchy areas of interstitial density with fluffy margins involving the right upper lobe, right middle lobe and the left lower lobe. I think this is most prominent in the left lower lobe.   IMPRESSION: A 44 year old with a complicated medical history requiring IV fluids through a Port-A-Cath due to chronic dehydration who developed a Mycobacterium mucinogen port infection. This has been treated with IV Avelox and azithromycin after the port was removed. There is debate about whether to continue this for its full course versus stopping it. He is in the hands of the [facility] infectious disease team for that. We are consulted because he has had a high fever yesterday and a CAT scan that shows diffuse infiltrates in his chest. Apparently at [facility] he also has these chronic abnormalities and is being worked up for it.   I suspect he is aspirating as the cause of his chest CT findings as well as his sudden fever spike. He does report that when he was vomiting a day or two prior he did have some coughing fits associated with it. He has basically no stomach after the multiple surgeries, therefore, he is at high risk of aspiration. He is also on pain meds, which would likely lead increased risk of aspiration.   RECOMMENDATIONS: At this point, I would recommend continuing the Avelox and the azithromycin. If he remains afebrile, white count is relatively normal tomorrow, and he remains clinically stable he could be discharged on the Avelox and the azithromycin and follow up with [facility]. He would need to bring a copy of the CT scan with him and could likely be evaluated there for the recurrent likely  aspiration. Thank you for the consult. I would be glad to follow with you. ____________________________ Stann Mainland. Sampson Goon, MD dpf:sb D: 04/30/2013 16:10:40 ET T: 04/30/2013 17:17:44 ET JOB#: [id number]  cc: Stann Mainland. Sampson Goon, MD, <Dictator> Doral Digangi Sampson Goon MD ELECTRONICALLY SIGNED 05/04/2013 22:32

## 2014-11-19 NOTE — H&P (Signed)
PATIENT NAME:  Bobby [name], Bobby [name] MR#:  [id number] DATE OF BIRTH:  September 15, 1970  DATE OF ADMISSION:  01/27/2013  PRIMARY CARE PHYSICIAN: Dr. Ellsworth Lennoxejan-Sie.    REFERRING PHYSICIAN: Dr. Jene Everyobert Kinner.   CHIEF COMPLAINT: Uncontrolled blood sugars.   HISTORY OF PRESENT ILLNESS: Bobby [name] is a 44 year old white male with history of pancreatectomy, partial gastrectomy. Was admitted to [facility] under the care of Dr. Ellsworth Lennoxejan-Sie from 01/14/2013 to 01/22/2013 for acute on chronic renal failure, dehydration and uncontrolled diabetes mellitus. During that admission, the patient was seen by endocrinology and nephrology, with multiple episodes of fluctuating blood sugars as well as mild worsening of renal insufficiency. The patient was doing well in the last 2 days. Since yesterday, started to experience somewhat nausea, vomiting and sweating. The patient was noted to have blood sugars of 400. Waited until the afternoon. They continued to trend up, associated with severe nausea. Concerning this, came to the Emergency Department. In the Emergency Department, the patient's initial blood sugars were 400 and 480. The patient was given IV fluids; however, continued to trend up to greater than 600. Concerning this, the decision was made to admit the patient for further control of the blood sugars. The patient was also noted to have elevated BUN and creatinine from  baseline of 1.4 to 1.6. The patient states could not keep down any of his food or fluids. At the time of the discharge, the patient's fentanyl was changed from 75 mcg of fentanyl patch from 72 hours to 48 hours. However, the patient states that from sitting had to take off the fentanyl patch. Currently asking for another patch. The patient received 4 mg of Dilaudid in the last few hours. Does not look in any distress. Denies having any fever. Denies having any diarrhea.   PAST MEDICAL HISTORY:  1. Type 1 diabetes mellitus, poorly controlled.  2. Diabetic  peripheral retinopathy.  3. Gastroparesis.  4. Hypertension.  5. 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  6. Multiple hospitalizations. This is the fifth admission in the last 2 months.  7. Peptic ulcer disease.  8. Migraine headaches.  9. Hypertension.  10. Anxiety.  11. Narcotic dependence.   PAST SURGICAL HISTORY:  1. 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  2. Gastric bypass surgery.  3. Surgery of volvulus.   4. Abdominal surgeries, including J-tube placement.   ALLERGIES: MORPHINE, TYLENOL, NSAIDS AND CORICIDIN.    MEDICATIONS:  1. Ambien 10 mg once a day.  2. Amlodipine 1 tablet once a day.  3. Calcarb 600 mg 2 times a day.  4. Carafate 2 g 4 times a day.  5. Diovan 80 mg once a day.  6. Gabapentin 800 mg 3 times a day.  7. Oxycodone 10 mg every 4 hours as needed.  8. Polyethylene glycol 17 g once a day.  9. Phenergan 25 mg every 8 hours.  10. Thiamine 100 mg once a day.  11. Vitamin C 1000 mg once a day.  12. Zenpep 25,000 units 3 capsules 3 times a day.  13. Insulin Lantus 10 units daily.  14. Lispro 3 units 3 times a day.   SOCIAL HISTORY: Denies smoking, drinking alcohol or using illicit drugs. Lives with his boyfriend.   FAMILY HISTORY: Both parents with diabetes mellitus.   REVIEW OF SYSTEMS:  CONSTITUTIONAL: Complains of generalized weakness.  EYES: No change in vision.  ENT: No change in hearing.  sore throat.  RESPIRATORY: No cough, shortness of breath.  GASTROINTESTINAL: Complains of nausea.  The patient states vomiting; however, did not vomit any since came to the Emergency Department.  GENITOURINARY: No dysuria or hematuria.  ENDOCRINE: Carries the diagnosis of diabetes mellitus. SKIN: No rash or lesions.  MUSCULOSKELETAL:  joint pains and aches.  NEUROLOGIC: The patient has numbness in both feet.  PSYCHIATRIC: No depression.   PHYSICAL EXAMINATION:  GENERAL: This revealed a thin built white male lying down in the bed, not in  distress.  VITAL SIGNS: Temperature 98.4, pulse 70, blood pressure 166/93, respiratory rate of 20, oxygen saturation 97% on room air.  HEENT: Head normocephalic, atraumatic. There is no scleral icterus. Conjunctivae normal. Pupils equal and react to light. Extraocular movements are intact. Mucous membranes moist. No pharyngeal erythema.  NECK: Supple. No lymphadenopathy. No JVD. No carotid bruit.  CHEST: Has no focal tenderness.  LUNGS: Bilaterally clear to auscultation.  HEART: S1 and S2 regular. No murmurs are heard. No lower extremity edema. Pulses 2+.  ABDOMEN: Bowel sounds present. Has extensive scarring from the previous surgeries. Soft, nontender, nondistended; however, the patient grimaces while palpating; however, while examining with stethoscope, does not seem to bother him.  NEUROLOGIC: The patient is alert, oriented to place, person and time. Cranial nerves II through XII intact. No motor and sensory deficits.   LABORATORY DATA: CBC: WBC of 6.5, hemoglobin 12, platelet count of 256. CMP: Glucose 574, BUN 30, creatinine of 2.33. The rest of the values are within normal limits.   UA negative for nitrites and leukocyte esterase. Lipase of 30.   ASSESSMENT AND PLAN: Bobby [name] is a 44 year old male with previous history of abdominal surgeries. Comes to the Emergency Department with complaints of nausea, vomiting and uncontrolled blood sugars.  1. Hyperglycemia: The patient was kept on 8 units of insulin. The patient had multiple episodes of hyperglycemia during the last admission. No obvious signs of any infection are seen. There is also concern about the patient has attention seeking behavior.  2. Acute renal failure secondary to hyperglycemia: Continue with intravenous fluids and follow up. The patient was seen by Dr. Cherylann Ratel.  3. Hypertension, poorly controlled: Continue with his home medications.  4. Keep the patient on deep vein thrombosis prophylaxis with Lovenox.  5. Narcotic-seeking  behavior: The patient states the patient is supposed to be on 100 mcg even though discharge summary as well as medication reconciliation list states that the patient should be on 75 mcg. Will start back on 75 mcg of fentanyl patch. The patient already  received 4 doses of 4 mg of Dilaudid in the Emergency Department and continues to complain of pain but does not seem to be in any distress.   TIME SPENT: 55 minutes.   ____________________________ Susa Griffins, MD pv:gb D: 01/27/2013 04:32:33 ET T: 01/27/2013 05:37:50 ET JOB#: [id number]  cc: Susa Griffins, MD, <Dictator> Sheikh A. Ellsworth Lennox, MD Susa Griffins MD ELECTRONICALLY SIGNED 02/10/2013 0:35

## 2014-11-19 NOTE — Consult Note (Signed)
Chief Complaint and History:  Referring Physician Dr. Ellsworth Lennoxejan-Sie   Chief Complaint uncontrolled type 1 diabetes   Allergies:  Coricidin: Swelling  Morphine: Hives, Itching  Tylenol: Other  Non-Steroidal Anti-Inflammatory Drugs: Do NOT Use  Assessment/Plan:  Assessment/Plan 44 yo M with chronically uncontrolled diabetes who is well known to me from prior hospitalizations was admitted yesterday with DKA. Reviewed labs, interviewed and examined pt. Sugars now in the 300s. Cause of DKA not known - no e/o infection. He may not be adhering to his prescribed insulin regimen.  A/ Continue Lantus 8 units q24 hours Add Humalog 3 units at meals once diet is advanced. Will modify Humalog SSI to 1-2 units per 50 that BG is >150.  I will follow with you.   Electronic Signatures: Raj JanusSolum, Niah Heinle M (MD)  (Signed 14-Jul-14 12:57)  Authored: Chief Complaint and History, ALLERGIES, Assessment/Plan   Last Updated: 14-Jul-14 12:57 by Raj JanusSolum, Marshe Shrestha M (MD)

## 2014-11-19 NOTE — H&P (Signed)
PATIENT NAME:  Bobby [name], Bobby [name] MR#:  [id number] DATE OF BIRTH:  Aug 27, 1970  DATE OF ADMISSION:  02/08/2013  PRIMARY CARE PHYSICIAN:  Dr. Elijio Miles.   REFERRING PHYSICIAN:  Dr. Lurline Hare.   CHIEF COMPLAINT:  Nausea, vomiting, abdominal pain, uncontrolled blood sugars.   HISTORY OF PRESENT ILLNESS:  Bobby [name] is a 44 year old white male with history of pancreatectomy, status post gastrectomy with numerous admissions for DKA in the last three months, presented to the Emergency Department with complaints of nausea, vomiting and abdominal pain.  The patient states has been having worsening of the blood sugars since yesterday.  Had multiple episodes of vomiting.  The patient states has been compliant with his medication and the diet.  The patient had a recent admission on 01/26/2013 and was discharged on 01/28/2013.  The patient was seen by endocrinology prior to be discharged.  Seen by Dr. Lucilla Lame, recommended to continue with Lantus 9 units in the morning and Humalog 3 units with each meal.  The patient states has been compliant with the diet and insulin intake.  The patient came to the Emergency Department with blood sugars of greater than 1,000 in DKA with a pH of 7.01 with a bicarb of 6.  The patient received 2 liters of IV fluids in the Emergency Department and started on insulin drip.  The patient is also found to have elevated AST of 1945 and ALT 345 with an alk phos of 263.  The patient states having abdominal pain from vomiting.  This pain is no different from the previous episodes of abdominal pain.  The patient experiences some subjective fevers.  Work-up in the Emergency Department, no obvious signs of infection were found.  No episodes of any diarrhea.   PAST MEDICAL HISTORY: 1.  Type 1 diabetes mellitus, poorly-controlled.  2.  Diabetic peripheral neuropathy.  3.  Gastroparesis.  4.  Hypertension.  5.  Chronic abdominal pain and chronically on narcotics.  6.  Multiple hospitalizations.   7.  Peptic ulcer disease.  8.  Migraine headaches.  9.  Hypertension.  10.  Anxiety.    PAST SURGICAL HISTORY: 1.  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  2.  Gastric bypass surgery.  3.  Surgery for the volvulus.  4.  G-tube placement.   ALLERGIES:   1.  MORPHINE.  2.  TYLENOL.  3.  NSAIDS.  4.  CORICIDIN.   HOME MEDICATIONS: 1.  Zenpep 25,000 units 3 capsules 3 times a day.  2.  Vitamin C 1000 mg daily.  3.  Thiamin 100 mg daily.  4.  Phenergan 25 mg every eight hours as needed.  5.  MiraLAX 17 grams daily.  6.  Oxycodone 10 mg every four hours as needed.  7.  Metoclopramide 5 mg 1 tablet 3 times a day.  8.  Lantus 10 units daily.  9.  Neurontin 800 mg 3 times a day.  10.  Fentanyl patch.  11.  Diovan 80 mg once a day.  12.  Carafate 2 grams 4 times a day.  13.  Calcarb 600 mg 2 times a day.  14.  Amlodipine 10 mg once a day.  15.  Ambien 10 mg once a day.   SOCIAL HISTORY:  No history of smoking, drinking, alcohol or using illicit drugs.  Lives with his boyfriend.    FAMILY HISTORY:  Both parents with diabetes mellitus.   REVIEW OF SYSTEMS: CONSTITUTIONAL:  Has severe generalized weakness, fatigue.  EYES:  Has blurry vision.  EARS, NOSE, THROAT:   No change in hearing.  No sore throat, tinnitus.  RESPIRATORY:  No cough, shortness of breath.  CARDIOVASCULAR:  Has chest pain, palpitations.  GASTROINTESTINAL:  Has nausea, vomiting.  GENITOURINARY:  No dysuria or hematuria.  ENDOCRINE:  History of diabetes mellitus.  SKIN:  No rash or lesions.  MUSCULOSKELETAL:  No joint pains and aches.  NEUROLOGIC:  No weakness or numbness in both feet.  PSYCHIATRIC:  Severe depression.   PHYSICAL EXAMINATION: GENERAL:  This is a thin built, frail-looking male lying down in the bed, tachypneic, looks lethargic.  VITAL SIGNS:  Temperature 97.8, pulse 113, blood pressure 172/77, respiratory rate of 22, oxygen saturations 99% on room air.  HEENT:  Head normocephalic,  atraumatic.  Eyes, no sclerae icterus.  Conjunctivae normal.  Pupils equal and react to light.  Extraocular movements intact.  Mucous membranes moist.  No pharyngeal erythema.  NECK:  Supple.  No lymphadenopathy.  No JVD.  No carotid bruit.  CHEST:  No focal tenderness.  LUNGS:  Bilaterally clear to auscultation.  HEART:  S1 and S2 regular.  No murmurs are heard.  No pedal edema.  Pulses 2+.  ABDOMEN:  Bowel sounds plus, soft.  Has multiple extensive scar tissue from the previous surgeries.  No hepatosplenomegaly.  SKIN:  No rash or lesions.  MUSCULOSKELETAL:  Good range of motion in all the extremities.  NEUROLOGIC:  The patient is alert, oriented to place, person and time.  Cranial nerves II through XII intact.  No motor and sensory deficits.   LABORATORY DATA:  CMP, glucose 1043, BUN 40, creatinine of 2.40, chloride 80, bicarb 6,  7.9, alk phos of 263, ALT 345, AST 1945.   ASSESSMENT AND PLAN:  Bobby [name] is a 44 year old male with another admission for diabetic ketoacidosis and hyperosmolar state.  1.  Diabetic ketoacidosis.  The patient's bicarb is 6.  Continue with insulin drip, IV fluids, continue to monitor Accu-Cheks q. 1 hour.  When anion gap closes, start back on home dose of the Lantus of 9 units.  2.  Diabetes mellitus, poorly-controlled.  Discussed with the patient extensively if patient has been recording his diet and insulin requirement on sliding scale insulin.  The patient states he does not keep track of his blood sugars.  Encouraged him to do so.  The patient will need to follow up with the endocrinology concerning the patient is a brittle diabetic.  During the hospital stay the patient's blood sugars were moderately well-controlled.  3.  Hypertension.  This could be secondary to vomiting and unable to keep down the medications.  We will keep the patient on labetalol as needed.  4.  Acute renal failure secondary to dehydration.  Continue with IV fluids and follow up.  5.   Elevated LFTs.  The patient is status post cholecystectomy.  We will continue to follow up after hydrating the patient.  6.  Keep the patient on DVT prophylaxis with SCDs.   TIME SPENT:  50 minutes.    ____________________________ Monica Becton, MD pv:ea D: 02/08/2013 02:50:17 ET T: 02/08/2013 03:34:53 ET JOB#: [id number]  cc: Monica Becton, MD, <Dictator> Sheikh A. Elijio Miles, MD Monica Becton MD ELECTRONICALLY SIGNED 02/11/2013 0:28

## 2014-11-19 NOTE — Consult Note (Signed)
PATIENT NAME:  Bobby [name], Bobby [name] MR#:  [id number] DATE OF BIRTH:  27-Feb-1971  DATE OF CONSULTATION:  04/11/2013  PRIMARY CARE PHYSICIAN: Dr. Ellsworth Lennox.   REFERRING PHYSICIAN: Katharina Caper, M.D.   CONSULTING PHYSICIAN: Stann Mainland. Sampson Goon, M.D.   REASON FOR CONSULTATION: Diarrhea and MAI bacteremia.   HISTORY OF PRESENT ILLNESS: This is a pleasant 44 year old male who has a very complicated abdominal past medical history who was admitted for chronic dehydration and acute renal failure. Apparently, his story began in 2004 after he underwent a Roux-en-Y gastric bypass. 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 He also had a gastrectomy secondary to ulcers that were found in his gastric pouch. He finally also had a small bowel obstruction following placement of a feeding tube into his bowel several years ago. 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 He was at one point getting IV fluids at the cancer center through report.   Per the patient's history, he was admitted several weeks ago to [facility] with dehydration. At that point, he was found to have some nodules in his lung. This was presumed due to a recent aspiration event. 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 He had his Port-A-Cath removed and a PICC line placed and was discharged about 1 week ago on IV Avelox and IV azithromycin. The plan is to continue this for 8 weeks to treat Mycobacterium.  His HIV test has been negative. He was getting antibiotics at home but was having progressive diarrhea due it and now developed worsening renal function so was readmitted with that and hyponatremia.   Since admission, the patient has had C. diff testing which was  negative. Stool culture which is pending. He has had IV fluids to hopefully improve his renal function and his hydration status.  Of note, he did speak with his ID doctor yesterday and was advised to be admitted here but could potentially be transferred to [facility] if needed.   PAST MEDICAL HISTORY:  As above, also including chronic pain from his chronic pancreatitis, diabetes type 1 from the pancreatectomy, peripheral diabetic neuropathy, gastroparesis, hypertension, peptic ulcer disease, status post gastrectomy, migraines, anxiety.    PAST SURGICAL HISTORY: As above.   ALLERGIES: MORPHINE, TYLENOL, NONSTEROIDAL ANTI-INFLAMMATORIES AS WELL AS CORICIDIN.  REVIEW OF SYSTEMS:  11 systems are reviewed and negative except as per history of present illness.   SOCIAL HISTORY: No tobacco, alcohol or drug.  Lives with his boyfriend. 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   FAMILY HISTORY: Both parents have diabetes.   MEDICATIONS: Current antibiotics include IV moxifloxacin and IV azithromycin. Other medications include Norvasc, Coreg, Colace, gabapentin, subcutaneous heparin, Dilaudid, Zofran, pancrelipase, MiraLax, Phenergan, Ambien, fentanyl patch, insulin, glargine and lispro as well as sliding scale insulin, Carafate.   PHYSICAL EXAMINATION: VITALS: T-max since admission is 98.2, pulse 82, blood pressure 135/76, pulse oximetry 100%.  GENERAL: He is thin, in no acute distress.  HEENT: Pupils equal, round, reactive to light and accommodation. Extraocular movements are intact. Sclerae anicteric. Oropharynx: Clear.  NECK: Supple.  HEART: Regular.  LUNGS: Clear.  ABDOMEN: Multiple prior surgical scars. Mild tenderness to palpation in the epigastric area which he says is chronic.  EXTREMITIES:  He has a PICC line  in his right upper extremity.  Lower extremities: No clubbing, cyanosis or edema.  NEUROLOGIC: He is alert and oriented x 3. Grossly nonfocal neuro exam.   LABORATORY  DATA:  White blood count on admission yesterday was 4.3, hemoglobin 10.2, platelets 252, creatinine on admission 09/12 was 2.59, now 2.64. Blood sugar was 662 yesterday,  441 today.  Blood cultures x 2 are negative. HIV test is negative.    IMAGING: Chest x-ray done yesterday shows no evidence of cardiopulmonary disease. [facility] not available.   IMPRESSION: A complicated 44 year old gentleman with multiple abdominal surgeries left with no stomach, no pancreas and chronic diarrhea and chronic pain. 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 This is quite unusual in non-AIDS patients.  He is being followed by Norris [facility]Mehri McKellar [facility], phone number [phone]. He also has surgeons [facility]. He has been followed by Dr. Orlie DakinFinnegan at the cancer center where he can sometimes get IV fluids once a week when he had his Port-A-Cath in place. Dr. Cherylann RatelLateef is his renal doctor and he has seen Dr. Marva PandaSkulskie once here for the chronic diarrhea.   RECOMMENDATIONS: At this time, I would recommend rehydrating with IV fluids. We will continue the Avelox and azithromycin both IV. These are given IV because of his profound malabsorption. He is not able to take them orally. I would also suggest he could be getting IV fluids as an outpatient. I will discuss with [facility] doctor at [facility] about whether there are other options as this is a most unusual presentation. He will need long term treatment in order to try to eradicate the mycobacterium. We will put in an order for AFB blood cultures to be done today.  Thank you for this interesting consult.    ____________________________ Stann Mainlandavid P. Sampson GoonFitzgerald, MD dpf:dp D: 04/11/2013 15:45:22 ET T: 04/11/2013 16:26:43 ET JOB#: [id number]  cc: Stann Mainlandavid P. Sampson GoonFitzgerald, MD, <Dictator> Kalkidan Caudell Sampson GoonFITZGERALD MD ELECTRONICALLY SIGNED 04/12/2013 21:42

## 2014-11-19 NOTE — Discharge Summary (Signed)
PATIENT NAME:  Bobby [name], Bobby [name] MR#:  [id number] DATE OF BIRTH:  1971-06-04  DATE OF ADMISSION:  04/23/2013 DATE OF DISCHARGE:  05/01/2013  DISCHARGE DIAGNOSES: 1.  Diabetic ketoacidosis.  2.  Diarrhea.  3.  Acute renal failure.  4.  Mycobacterium avium infection.  5.  Chronic pain   PROCEDURES: None.   CONSULTATIONS: Endocrinology, Dr. Tedd SiasSolum; infectious disease, Dr. Clydie Braunavid Fitzgerald; nephrology, Dr. Mady HaagensenMunsoor Lateef.   IMAGING: CT scan of the chest showed patchy areas of increased interstitial density mainly in the right upper lobe.   HOSPITAL COURSE: This gentleman was readmitted through the Emergency Room after being recently discharged. He was found to be in diabetic ketoacidosis. Please refer to the history and physical for full details. He was admitted to ICU, placed on intravenous insulin, high-rate intravenous fluids, namely normal saline.  His drip was discontinued after his anion gap closed and he was transferred to the medical floor. Once he was transferred to the floor, patient'Bobby [name] sugars became quite labile, as is characteristic for him. He also had issues with his pain management, taking off his fentanyl patch at will and not compliant with the doctor'Bobby [name] prescriptions. The patient was warned to refrain from this behavior. His sugars gradually improved to his baseline under the supervision of Dr. Tedd SiasSolum. The patient did also see Dr. Sampson GoonFitzgerald due to the fact he had a Mycobacterium avium infection and was instructed to complete his antibiotics. His chronic kidney disease also deteriorated during his stay; however, following fluids it did improve to some degree. Once again, nephrology saw him for that.   DISPOSITION:  The patient was discharged to home in satisfactory condition.   DISCHARGE INSTRUCTIONS:   DIET: ADA diet, low sodium diet.  ACTIVITY: As tolerated.  FOLLOWUP: In 1 to 2 weeks Dr. Ellsworth Lennoxejan-Sie.  DISCHARGE MEDICATIONS: Please refer to discharge medical reconciliation for his  medication list.   ____________________________ Silas FloodSheikh A. Ellsworth Lennoxejan-Sie, MD sat:cs D: 05/20/2013 14:03:53 ET T: 05/20/2013 14:35:37 ET JOB#: [id number]  cc: Marland McalpineSheikh A. Ellsworth Lennoxejan-Sie, MD, <Dictator> Charlesetta GaribaldiSHEIKH A TEJAN-SIE MD ELECTRONICALLY SIGNED 05/22/2013 13:47

## 2014-11-19 NOTE — Consult Note (Signed)
Chief Complaint and History:  Referring Physician Dr. Ellsworth Lennoxejan-Sie   Chief Complaint Uncontrolled diabetes   Allergies:  Coricidin: Swelling  Morphine: Hives, Itching  Tylenol: Other  Non-Steroidal Anti-Inflammatory Drugs: Do NOT Use  Assessment/Plan:  Assessment/Plan Pt seen, examined and chart reviewed. Has long h/o diabetes after pancreatectomy. Diabetes uncontrolled. He complains about the diet and requests reg diet. Sugars remain variable.  A/ Type 1 diabetes with neurologic complications, uncontrolled Diabetic peripheral neuropathy  P/ Change to reg diet Continue Lantus 10 units daily but start tomorrow giving at 8AM. Continue Humalog 3 units tid AC for now. May need more if consuming more carbs.  May consider adding extra Humalog at b/w meal snacks. Encouraged carb counting and limiting carb intake. Change SSI to Humalog, dose at 1 unit per 50 over target of 150 qACHS.  Full consult to be dictated.   Electronic Signatures: Raj JanusSolum, Anna M (MD)  (Signed 23-Jun-14 18:02)  Authored: Chief Complaint and History, ALLERGIES, Assessment/Plan   Last Updated: 23-Jun-14 18:02 by Raj JanusSolum, Anna M (MD)

## 2014-11-19 NOTE — Consult Note (Signed)
PATIENT NAME:  Bobby [name], Bobby [name] MR#:  [id number] DATE OF BIRTH:  [birth year]  ENDOCRINOLOGY CONSULTATION  DATE OF CONSULTATION:  02/09/2013  REQUESTING PROVIDER:  Pernell Dupre, MD  DICTATING CONSULTING:  Lavone Orn, MD  CHIEF COMPLAINT: Uncontrolled diabetes.   HISTORY OF PRESENT ILLNESS: This is a 44 year old male well-known to me with type 1 diabetes who was admitted yesterday with diabetic ketoacidosis. He had just been hospitalized and discharged last week. He explains that he resumed an outpatient regimen of Lantus 10 units daily and Humalog 3 units t.i.d. before meals, plus sliding-scale after discharge. He claims he was doing well up until yesterday morning, although recalls sugars were in the 200s in the days prior. Yesterday morning he started to feel increasingly weak and nauseated. This worsened as the day progressed. He claims he had been taking his sliding-scale regularly even though he was not able to tolerate any p.o. intake.   He presented to the ED on the evening of the 12 July with an initial serum blood glucose of 1083, a low bicarb of 6, elevated anion gap of 29 and elevated creatinine of 2.4. Potassium was normal at 4.9. He had an elevated AST and ALT of 1945 and 345. White blood cell count was normal. Urine was unremarkable, although he did have 2+ urinary ketones and elevated urine glucose level. He was initiated on IV fluids and IV insulin. He was admitted to the critical care unit. He remains hemodynamically stable, and blood sugars decreased over the course of the morning, and in fact he had a low serum glucose by 10 a.m. on 13 July. At that point IV dextrose was added, and due to closure of the anion gap of IV insulin was soon after stopped. He received Lantus 8 units in the afternoon, although by that time his blood sugars had increased again into the 300s. He is currently n.p.o. He continues to have nausea, and he reports a poor appetite. He has chronic abdominal pain.  This is unchanged. He denies dysuria. No fevers.   PAST MEDICAL HISTORY: 1.  Type 1 diabetes.  2.  Diabetic peripheral neuropathy.  3.  Recurrent DKA.  4.  Hypoglycemia unawareness.  5.  Hypertension.  6.  Vitamin D deficiency.  7.  Peptic ulcer disease.  8.  Pancreatic exocrine insufficiency.  9.  Chronic abdominal pain.  10.  Migraine headaches.  11.  Hiatal hernia.   PAST SURGICAL HISTORY: 1.  Gastric bypass, July 2004. 2.  Partial pancreatectomy, May 2008.  3.  Completion pancreatectomy, July 2009.  4.  Vagotomy, June 2011.   HOME MEDICATIONS: 1.  Lantus 10 units daily.  2.  Lispro 3 units t.i.d.  3.  Lispro, sliding-scale.  4.  Amlodipine 5 mg daily.  5.  Vitamin C 1000 mg daily. 6.  Calcium with vitamin D, 500/200, 2 tablets b.i.d.  7.  Enalapril 5 mg daily.  8.  Vitamin D 50,000 units weekly.  9.  Neurontin 800 mg t.i.d.  10.  Multivitamin 1 tablet daily.  11.  Creon 12,000 units, six capsules t.i.d. with meals.  12.  Carafate 1 gram q.i.d.  13.  Thiamine 1000 mg daily.  14.  Heparin, subcutaneous, 5000 units q.8 hours.  15.  Fentanyl patch 75 mcg q.2 days.   ALLERGIES: CORICIDIN, MORPHINE, TYLENOL.   SOCIAL HISTORY: The patient lives with his partner. No tobacco use.   FAMILY HISTORY: Mother and father with history of diabetes.   REVIEW OF SYSTEMS:  GENERAL: Ongoing  nausea. No fever.  HEENT: No blurred vision at present. Denies sore throat.  NECK: Denies neck pain. Denies dysphagia.  CARDIAC: Denies chest pain. Denies palpitations.  PULMONARY: Denies cough. Denies shortness of breath.  ABDOMEN: Reports chronic abdominal pain. Appetite is poor.  EXTREMITIES: Denies leg swelling.  GENITOURINARY: Denies dysuria, hematuria.  ENDOCRINE: Denies heat or cold intolerance.  NEUROLOGIC: Denies recent falls. Has had headaches.  HEMATOLOGIC: Denies recent bleeding, and denies easy bruisability.   PHYSICAL EXAMINATION: VITAL SIGNS: Height 70.9 inches, weight 150  pounds, BMI 20.9. Temperature 98.7, pulse 73, respirations 20, blood pressure 148/80, pulse ox is 96% on room air.  GENERAL: Chronically ill-appearing white male in no acute distress.  HEENT: EOMI.  OROPHARYNX: Clear. Mucous membranes moist.  NECK: Supple. No thyromegaly.  CHEST: He has a catheter in the right chest wall.  CARDIAC: Regular rate and rhythm. No carotid bruit.  PULMONARY: Clear to auscultation bilaterally. Good inspiratory effort. No wheeze.  ABDOMEN: Diffuse tenderness. Old scarring from prior abdominal surgeries. Positive bowel sounds. No focal tenderness. No guarding. No distention.  EXTREMITIES: No edema is present.  SKIN: No rash or neuropathy is present.  PSYCHIATRIC: Alert and oriented, calm and cooperative.   LABORATORY: At 3 a.m. today glucose 310, BUN 30 creatinine 2.24, sodium 142, potassium 5.1, chloride 115, CO2 22, eGFR 35, calcium 8.3, hemoglobin A1c 10.6%, WBC 9.7, hematocrit 27.4, platelets 324.   ASSESSMENT: A 44 year old male with uncontrolled insulin-dependent diabetes, status post prior pancreatectomy, complicated by neuropathy, also with chronic abdominal pain on multiple narcotics. He has had recurrent DKA, and was admitted yesterday for the same. No evidence of infection or other sources that would been known to induce DKA. I question his adherence to his insulin regimen.   RECOMMENDATIONS: 1.  Continue his Lantus, 8 units daily.  2.  Once diet is advanced, we will add Humalog 3 units t.i.d. before meals.   3.  Will adjust his Humalog sliding-scale to 1 to 2 units blood glucose of 50 over a target of 150.   I will follow along with you. He will need outpatient follow-up. He claims he cannot make our scheduled visit for this Friday, so we will have to reschedule for August.     ____________________________ A. Lavone Orn, MD ams:dm D: 02/09/2013 13:07:43 ET T: 02/09/2013 13:28:55 ET JOB#: [id number]  cc: A. Lavone Orn, MD, <Dictator> Sherlon Handing MD ELECTRONICALLY SIGNED 02/11/2013 22:00

## 2014-11-19 NOTE — H&P (Signed)
PATIENT NAME:  Bobby [name], Bobby [name] MR#:  [id number] DATE OF BIRTH:  11-10-1970  DATE OF ADMISSION:  12/01/2012  PRIMARY CARE PHYSICIAN: Dr. (Dictation Anomaly) Tejan-Sie.  The patient is a 44 year old Caucasian male with a past medical history significant for a history of pancreatectomy with resultant diabetic neuropathy and nephropathy, as well as diabetes type  1, history of exocrine pancreatic insufficiency, who presented to the hospital with complaints of high blood glucose levels. Patient stated that he was doing well up until approximately a week ago when he started noticing his sugars creeping up. He denies any other significant abnormalities. However, he stated that he was achy all over his body. He also is having more pains than his usual chronic pain besides, such as chronic abdominal pain areas. He is also more fatigued and weak. He has also noticed more blurring of vision as well as nausea and dry heaving. He feels dehydrated, however says that he was able to get something to eat and drink randomly.   PAST MEDICAL HISTORY: Significant for a history of admission to [facility] in October 2011. 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   MOST RECENT PROCEDURES: The patient was diagnosed with a bowel volvulus in August 2013, and had a repair of this and also had port placed in his right chest in August 2013.   PAST SURGICAL HISTORY: Gastric bypass surgery, partial pancreatectomy as well as vagotomy, and as mentioned above volvulus repair in August 2013.   SOCIAL HISTORY: No smoking, alcohol or drug abuse. He is disabled.   FAMILY HISTORY: The patient's mother has diabetes.   REVIEW OF  SYSTEMS:  CONSTITUTIONAL:  Positive for fatigue and weakness, pains in his abdomen, all over his body, blurring of vision. Some sinus congestion and nasal running of nose, feeling weak, dry-heaving, nauseated, having some intermittent diarrhea which is chronic, as well as urinary incontinence for which he uses Flomax. He also admits to no fever. (Dictation Anomaly) Has night sweats. No weight loss, weight gain.   EYES: Denies blurry vision, double vision, glaucoma or cataracts.  EARS, NOSE, THROAT: Denies any tinnitus, allergies, sinus pain, dentures, difficulty swallowing.  RESPIRATORY: Denies any coughing, wheezing. Denies COPD.   CARDIOVASCULAR:  Denies any chest pain, orthopnea, edema, arrhythmias, palpitations or syncope.  GASTROINTESTINAL:  Denies any hematemesis or (Dictation Anomaly) hematochezia, or change in bowel habits.  GENITOURINARY: Denies any dysuria, hematuria, or frequency.  ENDOCRINOLOGY: Denies any polydipsia, nocturia, thyroid problems, heat or cold intolerance or thirst.  HEMATOLOGIC: Denies anemia, easy bruising, bleeding, swollen glands.  SKIN: Denies any acne, rashes, lesions or change in moles.  MUSCULOSKELETAL: Denies arthritis, cramps, swelling.  NEUROLOGIC: No numbness, epilepsy or tremor.  PSYCHIATRIC: Denies anxiety, insomnia or depression.   PHYSICAL EXAMINATION: VITAL SIGNS: On arrival to the hospital, the patient's vital signs: Temperature was 98.3, pulse 82, respiration was 18, blood pressure 168/105, saturation was 98% on room air.  GENERAL: This is a well-developed, well-nourished, thin, pale and even grayish-looking gentleman who was not in acute distress, sitting on the stretcher.  HEENT: His pupils are equal, reactive to light. Extraocular muscles intact. No icterus or conjunctivitis. Has normal hearing. No pharyngeal erythema. Mucosa is dry.  NECK: No masses. Supple, nontender. Thyroid is not enlarged. No adenopathy. No JVD or carotid bruits  bilaterally.  Full range of motion.  LUNGS: Clear to auscultation in all fields. No rales, no rhonchi or wheezing.  No labored (Dictation Anomaly) inspirations, no dullness to percussion or overt respiratory distress.  CARDIOVASCULAR: S1, S2 appreciated. No murmurs, rubs.  PMI is not lateralized.  Chest is nontender to palpation.  EXTREMITIES: Pedal pulses 1+. No lower extremity edema, calf tenderness or cyanosis.  No kyphosis. Gait was not tested. ABDOMEN: Soft, nontender.Some mild distension. Bowel sounds are present, though diminished. No hepatosplenomegaly or masses were noted. Multiple well-healed scars were noted.  SKIN:  No rashes, lesions, erythema, nodularity or induration. It was warm and dry to palpation.  LYMPHATIC: No adenopathy in the cervical region.  NEUROLOGICAL: Cranial nerves grossly intact. Sensory is intact. No dysarthria or aphasia.  PSYCHIATRIC: The patient is alert and oriented to time, person and place, cooperative. Memory is good. No significant confusion, agitation or depression noted.   LABS: BMP showed glucose of 298. BUN and creatinine were 23 and 1.75. Sodium was 128. Bicarbonate was 26. Estimated GFR for non-African American would be 48. Hemoglobin A1c was checked and was found to be 9.8.   Beta-hydroxybutyrate was 15.1, which is markedly elevated.   Liver enzymes revealed alkaline phosphatase of 177, AST as well as ALT 79 and 81, respectively. White blood cell count is normal at 4.7, hemoglobin 10.9, platelet count 204.   ABGs: PH was done on venous blood, was 7.39 and pCO2 was 45.   An EKG was not done.   RADIOLOGIC STUDIES: None done.   ASSESSMENT AND PLAN: 1.  Hyperglycemia, with low-level acidosis: Admit the patient to the medical floor. Advance his insulin and Lantus dose. Hemoglobin A1c was found to be markedly elevated. The patient would benefit from advancement of Lantus doses as well as continuing of sliding-scale insulin for now.  2.  Chronic pain syndrome: Will  resume the patient's fentanyl as well as p.o. pills.  3. Acute-on-chronic renal failure: Will continue IV fluids. Will follow in the morning.  4. Hyponatremia, likely dehydration-related: Will continue IV fluids and we will follow in the morning.   Of note, the patient's medication list is as follows:   Ambien 10 mg p.o. at bedtime, calcium with vitamin D 500/200 two tablets twice a day, Diovan 40 mg p.o. daily, fentanyl 75 mg p.o. every 48 hours, gabapentin 800 mg p.o. 3 times daily, Humalog 8 units subcutaneously 3 times daily with meals and sliding-scale, Lantus 7 units in the morning and 5 units at bedtime, multivitamins 1 daily, Norvasc 5 mg daily, Zofran 4 mg 3 times daily as needed, oxycodone 10 mg p.o. every 4 hours as needed, thiamine 100 mg p.o. daily, vitamin B12 1000 mcg once monthly intramuscularly, vitamin C 1000 mg by mouth daily, vitamin D 250,000 units 3 times a week Mondays, Wednesdays, as well as Fridays, and Zenpep 25,000/136,000/85,000 units, 3 capsules 3 times daily with meals and 1 to 2 capsules with snacks.   Time spent: One hour.    ____________________________ Katharina Caper, MD rv:dm D: 12/01/2012 10:48:00 ET T: 12/01/2012 11:20:55 ET JOB#: [id number]  cc: Katharina Caper, MD, <Dictator> Christoher Drudge MD ELECTRONICALLY SIGNED 01/04/2013 17:24
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
